# Patient Record
Sex: Female | Born: 1960
Health system: Southern US, Community
[De-identification: ages and names within clinical notes are randomized; demographics above are authoritative.]

## PROBLEM LIST (undated history)

## (undated) DIAGNOSIS — I2510 Atherosclerotic heart disease of native coronary artery without angina pectoris: Secondary | ICD-10-CM

## (undated) DIAGNOSIS — R002 Palpitations: Secondary | ICD-10-CM

## (undated) DIAGNOSIS — F419 Anxiety disorder, unspecified: Secondary | ICD-10-CM

## (undated) DIAGNOSIS — E669 Obesity, unspecified: Secondary | ICD-10-CM

## (undated) DIAGNOSIS — I10 Essential (primary) hypertension: Secondary | ICD-10-CM

## (undated) HISTORY — DX: Atherosclerotic heart disease of native coronary artery without angina pectoris: I25.10

## (undated) HISTORY — DX: Palpitations: R00.2

## (undated) HISTORY — DX: Obesity, unspecified: E66.9

## (undated) HISTORY — DX: Essential (primary) hypertension: I10

## (undated) HISTORY — DX: Anxiety disorder, unspecified: F41.9

## (undated) HISTORY — PX: WISDOM TOOTH EXTRACTION: SHX21

## (undated) HISTORY — PX: COLPOPEXY: SHX920

---

## 2015-02-03 DIAGNOSIS — I491 Atrial premature depolarization: Secondary | ICD-10-CM | POA: Diagnosis not present

## 2015-02-03 DIAGNOSIS — M7710 Lateral epicondylitis, unspecified elbow: Secondary | ICD-10-CM | POA: Diagnosis not present

## 2015-02-04 ENCOUNTER — Other Ambulatory Visit (HOSPITAL_COMMUNITY): Payer: Self-pay | Admitting: Family Medicine

## 2015-02-04 ENCOUNTER — Ambulatory Visit (HOSPITAL_COMMUNITY)
Admission: RE | Admit: 2015-02-04 | Discharge: 2015-02-04 | Disposition: A | Discharge status: Home / Self Care | Payer: 59 | Source: Ambulatory Visit | Attending: Family Medicine | Admitting: Family Medicine

## 2015-02-04 DIAGNOSIS — M25521 Pain in right elbow: Secondary | ICD-10-CM | POA: Diagnosis not present

## 2015-05-12 ENCOUNTER — Encounter: Payer: Self-pay | Admitting: *Deleted

## 2015-05-16 ENCOUNTER — Encounter: Payer: Self-pay | Admitting: Cardiovascular Disease

## 2015-05-16 ENCOUNTER — Ambulatory Visit (INDEPENDENT_AMBULATORY_CARE_PROVIDER_SITE_OTHER): Payer: 59 | Admitting: Cardiovascular Disease

## 2015-05-16 VITALS — BP 104/61 | HR 73 | Ht <= 58 in | Wt 105.0 lb

## 2015-05-16 DIAGNOSIS — R002 Palpitations: Secondary | ICD-10-CM | POA: Diagnosis not present

## 2015-05-16 DIAGNOSIS — Z136 Encounter for screening for cardiovascular disorders: Secondary | ICD-10-CM | POA: Diagnosis not present

## 2015-05-16 DIAGNOSIS — F419 Anxiety disorder, unspecified: Secondary | ICD-10-CM | POA: Diagnosis not present

## 2015-05-16 DIAGNOSIS — I491 Atrial premature depolarization: Secondary | ICD-10-CM

## 2015-05-16 MED ORDER — ALPRAZOLAM 0.5 MG PO TABS: 0.5000 mg | ORAL_TABLET | Freq: Two times a day (BID) | ORAL | Status: DC

## 2015-05-16 NOTE — Patient Instructions (Signed)
   Xanax refilled today.  Continue all other medications.   Your physician wants you to follow up in:  1 year.  You will receive a reminder letter in the mail one-two months in advance.  If you don't receive a letter, please call our office to schedule the follow up appointment

## 2015-05-16 NOTE — Progress Notes (Signed)
Patient ID: Stephanie Hamilton, female   DOB: 28-Jun-1960, 55 y.o.   MRN: [id number]       CARDIOLOGY CONSULT NOTE  Patient ID: Stephanie Hamilton MRN: [id number] DOB/AGE: 04-Feb-1960 55 y.o.  Admit date: (Not on file) Primary Physician: Stephanie Shad, MD Referring Physician: Teryl Lucy MD  Reason for Consultation: palpitations  HPI: The patient is a 55 year old woman with a history of anxiety and palpitations.  She was diagnosed with palpitations and PACs by Holter monitoring in 2000. She saw a cardiologist back then and she has been maintained on Xanax 0.5 mg twice daily for the past 17 years since and she has remained stable. She has never undergone an echocardiogram or stress test.   She denies exertional chest pain, shortness of breath, dizziness, leg swelling, and syncope. She once tried weaning herself off Xanax and experienced a recurrence of palpitations and wishes to remain on it. She said she takes it as prescribed and never abuses it.   She said both she and her husband are under a lot of stress with her son, Stephanie Hamilton, who was born with hydrocephalus and has had at least two VP shunts and suffers with seizures.  ECG performed in the office today which I personally reviewed demonstrates normal sinus rhythm with no ischemic ST segment or T-wave abnormalities, nor any arrhythmias.  Soc: Her husband is Stage manager, Research officer, trade union at [employer]. Son, Stephanie Hamilton (age 17 in 05/2015), born with hydrocephalus and has a VP shunt with h/o seizures.   Allergies  Allergen Reactions  . Bactrim [Sulfamethoxazole-Trimethoprim]   . Compazine [Prochlorperazine Edisylate]   . Tramadol     Current Outpatient Prescriptions  Medication Sig Dispense Refill  . ALPRAZolam (XANAX) 0.5 MG tablet Take 0.5 mg by mouth 2 (two) times daily.    . Calcium Carbonate-Vitamin D (CALTRATE 600+D) 600-400 MG-UNIT tablet Take 2 tablets by mouth daily.    Marland Kitchen Lysine 500 MG TABS Take 1 tablet by mouth daily.    .  Multiple Vitamin (MULTIVITAMIN) tablet Take 1 tablet by mouth daily.     No current facility-administered medications for this visit.    Past Medical History  Diagnosis Date  . Hypertension   . CAD (coronary artery disease)   . Palpitations   . Obesity   . Anxiety     No past surgical history on file.  Social History   Social History  . Marital Status: Married    Spouse Name: N/A  . Number of Children: N/A  . Years of Education: N/A   Occupational History  . Not on file.   Social History Main Topics  . Smoking status: Not on file  . Smokeless tobacco: Not on file  . Alcohol Use: Not on file  . Drug Use: Not on file  . Sexual Activity: Not on file   Other Topics Concern  . Not on file   Social History Narrative  . No narrative on file     No family history of premature CAD in 1st degree relatives.  Prior to Admission medications   Medication Sig Start Date End Date Taking? Authorizing Provider  ALPRAZolam Duanne Moron) 0.5 MG tablet Take 0.5 mg by mouth 2 (two) times daily.    Historical Provider, MD  Calcium Carbonate-Vitamin D (CALTRATE 600+D) 600-400 MG-UNIT tablet Take 2 tablets by mouth daily.    Historical Provider, MD  Lysine 500 MG TABS Take 1 tablet by mouth daily.    Historical Provider, MD  Multiple Vitamin (MULTIVITAMIN) tablet Take  1 tablet by mouth daily.    Historical Provider, MD     Review of systems complete and found to be negative unless listed above in HPI     Physical exam There were no vitals taken for this visit. General: NAD Neck: No JVD, no thyromegaly or thyroid nodule.  Lungs: Clear to auscultation bilaterally with normal respiratory effort. CV: Nondisplaced PMI. Regular rate and rhythm, normal S1/S2, no S3/S4, no murmur.  No peripheral edema.  No carotid bruit.  Normal pedal pulses.  Abdomen: Soft, nontender, no hepatosplenomegaly, no distention.  Skin: Intact without lesions or rashes.  Neurologic: Alert and oriented x 3.    Psych: Normal affect. Extremities: No clubbing or cyanosis.  HEENT: Normal.   ECG: Most recent ECG reviewed.  Labs:  No results found for: WBC, HGB, HCT, MCV, PLT No results for input(s): NA, K, CL, CO2, BUN, CREATININE, CALCIUM, PROT, BILITOT, ALKPHOS, ALT, AST, GLUCOSE in the last 168 hours.  Invalid input(s): LABALBU No results found for: CKTOTAL, CKMB, CKMBINDEX, TROPONINI No results found for: CHOL No results found for: HDL No results found for: LDLCALC No results found for: TRIG No results found for: CHOLHDL No results found for: LDLDIRECT       Studies: No results found.  ASSESSMENT AND PLAN:  1. Symptomatic PAC's: She has been stable for nearly 17 years on Xanax 0.5 mg twice daily. She is under a lot of stress at home caring for her son. She once tried weaning herself off of it and had a recurrence of palpitations. She would like to remain on it. I do not see a problem with this. She has never abused it.  Dispo: fu 1 year.   Signed: Kate Hamilton, M.D., F.A.C.C.  05/16/2015, 2:43 PM

## 2015-10-19 DIAGNOSIS — Z01419 Encounter for gynecological examination (general) (routine) without abnormal findings: Secondary | ICD-10-CM | POA: Diagnosis not present

## 2015-10-19 DIAGNOSIS — R319 Hematuria, unspecified: Secondary | ICD-10-CM | POA: Diagnosis not present

## 2015-10-19 DIAGNOSIS — Z1212 Encounter for screening for malignant neoplasm of rectum: Secondary | ICD-10-CM | POA: Diagnosis not present

## 2015-11-23 ENCOUNTER — Telehealth: Payer: Self-pay | Admitting: *Deleted

## 2015-11-23 NOTE — Telephone Encounter (Signed)
How much of the medication does she have left, I would prefer Dr Raliegh Ip to decide whether to refill for her  Stephanie Tayjah Lobdell MD

## 2015-11-23 NOTE — Telephone Encounter (Signed)
Fax received from pharm requesting Alprazolam 0.5mg  refill.  Dr. Bronson Ing did give rx for this at last OV.

## 2015-11-25 NOTE — Telephone Encounter (Signed)
Notified patient.  Stated she will be out on Monday.  Will fwd to provider to see if we can give enough till Dr. Bronson Ing comes back since he give initial prescription.

## 2015-11-25 NOTE — Telephone Encounter (Signed)
I am ok refilling her xanax for 2 weeks until Dr Raliegh Ip comes back   Zandra Abts MD

## 2015-11-28 ENCOUNTER — Other Ambulatory Visit: Payer: Self-pay

## 2015-11-28 MED ORDER — ALPRAZOLAM 0.5 MG PO TABS: 0.5000 mg | ORAL_TABLET | Freq: Two times a day (BID) | ORAL | 5 refills | Status: DC

## 2015-11-28 NOTE — Telephone Encounter (Signed)
Sent in RX

## 2015-11-29 NOTE — Telephone Encounter (Signed)
Patient notified that refills had already been sent to pharmacy.

## 2015-12-20 DIAGNOSIS — R509 Fever, unspecified: Secondary | ICD-10-CM | POA: Diagnosis not present

## 2015-12-20 DIAGNOSIS — R05 Cough: Secondary | ICD-10-CM | POA: Diagnosis not present

## 2015-12-20 DIAGNOSIS — J111 Influenza due to unidentified influenza virus with other respiratory manifestations: Secondary | ICD-10-CM | POA: Diagnosis not present

## 2016-01-18 DIAGNOSIS — Z719 Counseling, unspecified: Secondary | ICD-10-CM | POA: Diagnosis not present

## 2016-01-18 DIAGNOSIS — N390 Urinary tract infection, site not specified: Secondary | ICD-10-CM | POA: Diagnosis not present

## 2016-05-17 ENCOUNTER — Ambulatory Visit (INDEPENDENT_AMBULATORY_CARE_PROVIDER_SITE_OTHER): Payer: 59 | Admitting: Cardiovascular Disease

## 2016-05-17 ENCOUNTER — Encounter: Payer: Self-pay | Admitting: Cardiovascular Disease

## 2016-05-17 VITALS — BP 102/64 | HR 81 | Ht <= 58 in | Wt 107.6 lb

## 2016-05-17 DIAGNOSIS — I491 Atrial premature depolarization: Secondary | ICD-10-CM | POA: Diagnosis not present

## 2016-05-17 DIAGNOSIS — F419 Anxiety disorder, unspecified: Secondary | ICD-10-CM | POA: Diagnosis not present

## 2016-05-17 DIAGNOSIS — R002 Palpitations: Secondary | ICD-10-CM

## 2016-05-17 MED ORDER — ALPRAZOLAM 0.5 MG PO TABS: 0.5000 mg | ORAL_TABLET | Freq: Two times a day (BID) | ORAL | 4 refills | Status: DC

## 2016-05-17 NOTE — Patient Instructions (Signed)

## 2016-05-17 NOTE — Progress Notes (Signed)
SUBJECTIVE: The patient presents for follow-up of symptomatic PACs. She takes Xanax 0.5 mg twice daily to alleviate this as she is under a lot of stress at home caring for her son. She once tried weaning herself off of Xanax and had a recurrence of palpitations. Her son was born with hydrocephalus and has had at least 2 VP shunts and suffers with seizures.  She is doing well. [name] recently got a job at [employer] but unfortunately the insurance changed and his prescriptions are no longer covered. This has led to a lot of stress for both she and her husband.  Caffeine and chocolate aggravate palpitations but she seldom consumes them.  She denies chest pain, leg swelling, and shortness of breath.  ECG performed in the office today which I ordered and personally interpreted demonstrates normal sinus rhythm with no ischemic ST segment or T-wave abnormalities, nor any arrhythmias.    Soc Hx: Her husband is Stage manager, Research officer, trade union at [employer]. Son, [name] (age 56 in 05/2016), born with hydrocephalus and has a VP shunt with h/o seizures.   Review of Systems: As per "subjective", otherwise negative.  Allergies  Allergen Reactions  . Bactrim [Sulfamethoxazole-Trimethoprim] Other (See Comments)    Fast heart rate Shaky feeling  . Compazine [Prochlorperazine Edisylate] Other (See Comments)    Made her neck feel like it was being twisted on one side  . Tramadol Nausea And Vomiting    Current Outpatient Prescriptions  Medication Sig Dispense Refill  . ALPRAZolam (XANAX) 0.5 MG tablet Take 1 tablet (0.5 mg total) by mouth 2 (two) times daily. 60 tablet 5  . Calcium Carbonate-Vitamin D (CALTRATE 600+D) 600-400 MG-UNIT tablet Take 2 tablets by mouth daily.    . Cranberry Fruit Concentrate 12600 MG CAPS Take by mouth daily.    Marland Kitchen Lysine 500 MG TABS Take 1 tablet by mouth daily.    . Probiotic Product (PROBIOTIC-10 PO) Take by mouth.    . Pseudoephedrine-Ibuprofen (ADVIL  COLD/SINUS PO) Take 1 tablet by mouth daily.     No current facility-administered medications for this visit.     Past Medical History:  Diagnosis Date  . Anxiety   . CAD (coronary artery disease)   . Hypertension   . Obesity   . Palpitations     Past Surgical History:  Procedure Laterality Date  . COLPOPEXY    . WISDOM TOOTH EXTRACTION      Social History   Social History  . Marital status: Married    Spouse name: N/A  . Number of children: N/A  . Years of education: N/A   Occupational History  . Not on file.   Social History Main Topics  . Smoking status: Never Smoker  . Smokeless tobacco: Never Used  . Alcohol use Not on file  . Drug use: Unknown  . Sexual activity: Not on file   Other Topics Concern  . Not on file   Social History Narrative  . No narrative on file     Vitals:   05/17/16 1005  BP: 102/64  Pulse: 81  SpO2: 98%  Weight: 107 lb 9.6 oz (48.8 kg)  Height: 4\' 10"  (1.473 m)    Wt Readings from Last 3 Encounters:  05/17/16 107 lb 9.6 oz (48.8 kg)  05/16/15 105 lb (47.6 kg)     PHYSICAL EXAM General: NAD HEENT: Normal. Neck: No JVD, no thyromegaly. Lungs: Clear to auscultation bilaterally with normal respiratory effort. CV: Nondisplaced PMI.  Regular rate and rhythm, normal S1/S2, no S3/S4, no murmur. No pretibial or periankle edema.  No carotid bruit.   Abdomen: Soft, nontender, no distention.  Neurologic: Alert and oriented.  Psych: Normal affect. Skin: Normal. Musculoskeletal: No gross deformities.    ECG: Most recent ECG reviewed.   Labs: No results found for: K, BUN, CREATININE, ALT, TSH, HGB   Lipids: No results found for: LDLCALC, LDLDIRECT, CHOL, TRIG, HDL     ASSESSMENT AND PLAN:  1. Symptomatic PAC's: Symptomatically stable. She has used Xanax 0.5 mg twice daily for nearly 18 years. She is under a lot of stress at home caring for her son. She once tried weaning herself off of Xanax and had a recurrence of  palpitations. She has never abused Xanax. I will refill her prescription.    Disposition: Follow up 1 year  Kate Sable, M.D., F.A.C.C.

## 2016-05-17 NOTE — Addendum Note (Signed)
Addended by: Laurine Blazer on: 05/17/2016 10:47 AM   Modules accepted: Orders

## 2016-06-04 DIAGNOSIS — H35371 Puckering of macula, right eye: Secondary | ICD-10-CM | POA: Diagnosis not present

## 2016-06-04 DIAGNOSIS — H2513 Age-related nuclear cataract, bilateral: Secondary | ICD-10-CM | POA: Diagnosis not present

## 2016-09-01 DIAGNOSIS — Z789 Other specified health status: Secondary | ICD-10-CM | POA: Diagnosis not present

## 2016-09-01 DIAGNOSIS — N390 Urinary tract infection, site not specified: Secondary | ICD-10-CM | POA: Diagnosis not present

## 2016-10-01 DIAGNOSIS — F410 Panic disorder [episodic paroxysmal anxiety] without agoraphobia: Secondary | ICD-10-CM | POA: Diagnosis not present

## 2016-10-01 DIAGNOSIS — Z23 Encounter for immunization: Secondary | ICD-10-CM | POA: Diagnosis not present

## 2016-10-01 DIAGNOSIS — Z6822 Body mass index (BMI) 22.0-22.9, adult: Secondary | ICD-10-CM | POA: Diagnosis not present

## 2016-10-16 ENCOUNTER — Other Ambulatory Visit: Payer: Self-pay | Admitting: *Deleted

## 2016-10-16 MED ORDER — ALPRAZOLAM 0.5 MG PO TABS: 0.5000 mg | ORAL_TABLET | Freq: Two times a day (BID) | ORAL | 4 refills | Status: DC

## 2016-10-18 DIAGNOSIS — Z1211 Encounter for screening for malignant neoplasm of colon: Secondary | ICD-10-CM | POA: Diagnosis not present

## 2016-10-18 DIAGNOSIS — Z1212 Encounter for screening for malignant neoplasm of rectum: Secondary | ICD-10-CM | POA: Diagnosis not present

## 2016-10-22 DIAGNOSIS — Z1212 Encounter for screening for malignant neoplasm of rectum: Secondary | ICD-10-CM | POA: Diagnosis not present

## 2016-10-22 DIAGNOSIS — Z01419 Encounter for gynecological examination (general) (routine) without abnormal findings: Secondary | ICD-10-CM | POA: Diagnosis not present

## 2016-10-22 DIAGNOSIS — Z1151 Encounter for screening for human papillomavirus (HPV): Secondary | ICD-10-CM | POA: Diagnosis not present

## 2016-10-26 DIAGNOSIS — Z23 Encounter for immunization: Secondary | ICD-10-CM | POA: Diagnosis not present

## 2016-12-26 DIAGNOSIS — N390 Urinary tract infection, site not specified: Secondary | ICD-10-CM | POA: Diagnosis not present

## 2016-12-26 DIAGNOSIS — Z6823 Body mass index (BMI) 23.0-23.9, adult: Secondary | ICD-10-CM | POA: Diagnosis not present

## 2017-03-09 DIAGNOSIS — J029 Acute pharyngitis, unspecified: Secondary | ICD-10-CM | POA: Diagnosis not present

## 2017-03-09 DIAGNOSIS — J111 Influenza due to unidentified influenza virus with other respiratory manifestations: Secondary | ICD-10-CM | POA: Diagnosis not present

## 2017-03-13 ENCOUNTER — Other Ambulatory Visit: Payer: Self-pay

## 2017-03-13 MED ORDER — ALPRAZOLAM 0.5 MG PO TABS: 0.5000 mg | ORAL_TABLET | Freq: Two times a day (BID) | ORAL | 4 refills | Status: DC

## 2017-03-26 DIAGNOSIS — Z13220 Encounter for screening for lipoid disorders: Secondary | ICD-10-CM | POA: Diagnosis not present

## 2017-03-26 DIAGNOSIS — R5383 Other fatigue: Secondary | ICD-10-CM | POA: Diagnosis not present

## 2017-03-26 DIAGNOSIS — F410 Panic disorder [episodic paroxysmal anxiety] without agoraphobia: Secondary | ICD-10-CM | POA: Diagnosis not present

## 2017-04-01 DIAGNOSIS — Z6823 Body mass index (BMI) 23.0-23.9, adult: Secondary | ICD-10-CM | POA: Diagnosis not present

## 2017-04-01 DIAGNOSIS — F410 Panic disorder [episodic paroxysmal anxiety] without agoraphobia: Secondary | ICD-10-CM | POA: Diagnosis not present

## 2017-05-16 ENCOUNTER — Encounter: Payer: Self-pay | Admitting: Cardiovascular Disease

## 2017-05-16 ENCOUNTER — Ambulatory Visit: Payer: 59 | Admitting: Cardiovascular Disease

## 2017-05-16 VITALS — BP 120/72 | HR 78 | Ht 59.0 in | Wt 110.0 lb

## 2017-05-16 DIAGNOSIS — R002 Palpitations: Secondary | ICD-10-CM | POA: Diagnosis not present

## 2017-05-16 DIAGNOSIS — I491 Atrial premature depolarization: Secondary | ICD-10-CM | POA: Diagnosis not present

## 2017-05-16 NOTE — Patient Instructions (Signed)

## 2017-05-16 NOTE — Progress Notes (Signed)
ekg 

## 2017-05-16 NOTE — Progress Notes (Signed)
SUBJECTIVE: The patient presents for follow-up of symptomatic PACs. She takes Xanax 0.5 mg twice daily to alleviate this as she is under a lot of stress at home caring for her son. She once tried weaning herself off of Xanax and had a recurrence of palpitations.  She has been on Xanax since 2001. Her son, [name], was born with hydrocephalus and has had at least 2 VP shunts and suffers with seizures.  Caffeine and chocolate aggravate palpitations but she seldom consumes them.  She tries to drink diet tea and decaffeinated sodas.  She drinks a glass of red wine about 3 times per week.  She and her husband have been struggling emotionally recently as their 4 year old niece, [name] sister's youngest daughter, committed suicide last week at the age of 56.  She had been apparently been bullied in school in [place].  She has been walking at least 7000 steps on a daily basis.  She also uses a rowing machine for 15-minute intervals.  ECG performed in the office today which I ordered and personally interpreted demonstrates normal sinus rhythm with no ischemic ST segment or T-wave abnormalities, nor any arrhythmias.    Soc Hx: Her husband, [name], is an Research officer, trade union at [employer] and [employer]. Their son, [name] (age 44 in 05/2016), was born with hydrocephalus and has a VP shunt with h/o seizures.     Review of Systems: As per "subjective", otherwise negative.  Allergies  Allergen Reactions  . Bactrim [Sulfamethoxazole-Trimethoprim] Other (See Comments)    Fast heart rate Shaky feeling  . Compazine [Prochlorperazine Edisylate] Other (See Comments)    Made her neck feel like it was being twisted on one side  . Tramadol Nausea And Vomiting    Current Outpatient Medications  Medication Sig Dispense Refill  . ALPRAZolam (XANAX) 0.5 MG tablet Take 1 tablet (0.5 mg total) by mouth 2 (two) times daily. 60 tablet 4  . Calcium Carbonate-Vitamin D (CALTRATE 600+D) 600-400  MG-UNIT tablet Take 2 tablets by mouth daily.    . Cranberry Fruit Concentrate 12600 MG CAPS Take by mouth daily.    Marland Kitchen Lysine 500 MG TABS Take 1 tablet by mouth daily.    . Probiotic Product (PROBIOTIC-10 PO) Take by mouth.    . Pseudoephedrine-Ibuprofen (ADVIL COLD/SINUS PO) Take 1 tablet by mouth daily.     No current facility-administered medications for this visit.     Past Medical History:  Diagnosis Date  . Anxiety   . CAD (coronary artery disease)   . Hypertension   . Obesity   . Palpitations     Past Surgical History:  Procedure Laterality Date  . COLPOPEXY    . WISDOM TOOTH EXTRACTION      Social History   Socioeconomic History  . Marital status: Married    Spouse name: Not on file  . Number of children: Not on file  . Years of education: Not on file  . Highest education level: Not on file  Occupational History  . Not on file  Social Needs  . Financial resource strain: Not on file  . Food insecurity:    Worry: Not on file    Inability: Not on file  . Transportation needs:    Medical: Not on file    Non-medical: Not on file  Tobacco Use  . Smoking status: Never Smoker  . Smokeless tobacco: Never Used  Substance and Sexual Activity  . Alcohol use: Not on file  . Drug  use: Not on file  . Sexual activity: Not on file  Lifestyle  . Physical activity:    Days per week: Not on file    Minutes per session: Not on file  . Stress: Not on file  Relationships  . Social connections:    Talks on phone: Not on file    Gets together: Not on file    Attends religious service: Not on file    Active member of club or organization: Not on file    Attends meetings of clubs or organizations: Not on file    Relationship status: Not on file  . Intimate partner violence:    Fear of current or ex partner: Not on file    Emotionally abused: Not on file    Physically abused: Not on file    Forced sexual activity: Not on file  Other Topics Concern  . Not on file    Social History Narrative  . Not on file     Vitals:   05/16/17 1528  BP: 120/72  Pulse: 78  SpO2: 98%  Weight: 110 lb (49.9 kg)  Height: 4\' 11"  (1.499 m)    Wt Readings from Last 3 Encounters:  05/16/17 110 lb (49.9 kg)  05/17/16 107 lb 9.6 oz (48.8 kg)  05/16/15 105 lb (47.6 kg)     PHYSICAL EXAM General: NAD HEENT: Normal. Neck: No JVD, no thyromegaly. Lungs: Clear to auscultation bilaterally with normal respiratory effort. CV: Regular rate and rhythm, normal S1/S2, no S3/S4, no murmur. No pretibial or periankle edema.  No carotid bruit.   Abdomen: Soft, nontender, no distention.  Neurologic: Alert and oriented.  Psych: Normal affect. Skin: Normal. Musculoskeletal: No gross deformities.    ECG: Most recent ECG reviewed.   Labs: No results found for: K, BUN, CREATININE, ALT, TSH, HGB   Lipids: No results found for: LDLCALC, LDLDIRECT, CHOL, TRIG, HDL     ASSESSMENT AND PLAN:  1. Symptomatic PAC's: Symptomatically stable. She has used Xanax 0.5 mg twice daily for nearly 18 years. She is under a lot of stress at home caring for her son. She once tried weaning herself off of Xanax and had a recurrence of palpitations. She has never abused Xanax. I will refill her prescription when she is due for refills in August 2019.     Disposition: Follow up 1 year   Kate Sable, M.D., F.A.C.C.

## 2017-08-05 ENCOUNTER — Other Ambulatory Visit: Payer: Self-pay | Admitting: *Deleted

## 2017-08-05 MED ORDER — ALPRAZOLAM 0.5 MG PO TABS: 0.5000 mg | ORAL_TABLET | Freq: Two times a day (BID) | ORAL | 5 refills | Status: DC

## 2017-10-14 DIAGNOSIS — Z6823 Body mass index (BMI) 23.0-23.9, adult: Secondary | ICD-10-CM | POA: Diagnosis not present

## 2017-10-14 DIAGNOSIS — M545 Low back pain: Secondary | ICD-10-CM | POA: Diagnosis not present

## 2017-10-14 DIAGNOSIS — G252 Other specified forms of tremor: Secondary | ICD-10-CM | POA: Diagnosis not present

## 2017-10-14 DIAGNOSIS — R5383 Other fatigue: Secondary | ICD-10-CM | POA: Diagnosis not present

## 2017-10-14 DIAGNOSIS — F410 Panic disorder [episodic paroxysmal anxiety] without agoraphobia: Secondary | ICD-10-CM | POA: Diagnosis not present

## 2017-10-14 DIAGNOSIS — Z1389 Encounter for screening for other disorder: Secondary | ICD-10-CM | POA: Diagnosis not present

## 2017-10-14 DIAGNOSIS — Z23 Encounter for immunization: Secondary | ICD-10-CM | POA: Diagnosis not present

## 2017-10-14 DIAGNOSIS — Z1331 Encounter for screening for depression: Secondary | ICD-10-CM | POA: Diagnosis not present

## 2017-10-17 ENCOUNTER — Other Ambulatory Visit (HOSPITAL_COMMUNITY): Payer: Self-pay | Admitting: Family Medicine

## 2017-10-17 DIAGNOSIS — R748 Abnormal levels of other serum enzymes: Secondary | ICD-10-CM

## 2017-10-23 ENCOUNTER — Ambulatory Visit (HOSPITAL_COMMUNITY): Payer: 59

## 2017-10-31 DIAGNOSIS — R3129 Other microscopic hematuria: Secondary | ICD-10-CM | POA: Diagnosis not present

## 2017-10-31 DIAGNOSIS — R5383 Other fatigue: Secondary | ICD-10-CM | POA: Diagnosis not present

## 2017-10-31 DIAGNOSIS — Z9189 Other specified personal risk factors, not elsewhere classified: Secondary | ICD-10-CM | POA: Diagnosis not present

## 2017-10-31 DIAGNOSIS — R748 Abnormal levels of other serum enzymes: Secondary | ICD-10-CM | POA: Diagnosis not present

## 2017-10-31 DIAGNOSIS — Z13220 Encounter for screening for lipoid disorders: Secondary | ICD-10-CM | POA: Diagnosis not present

## 2017-10-31 DIAGNOSIS — Z01411 Encounter for gynecological examination (general) (routine) with abnormal findings: Secondary | ICD-10-CM | POA: Diagnosis not present

## 2017-10-31 DIAGNOSIS — Z01419 Encounter for gynecological examination (general) (routine) without abnormal findings: Secondary | ICD-10-CM | POA: Diagnosis not present

## 2017-10-31 DIAGNOSIS — Z1212 Encounter for screening for malignant neoplasm of rectum: Secondary | ICD-10-CM | POA: Diagnosis not present

## 2017-11-14 DIAGNOSIS — Z9189 Other specified personal risk factors, not elsewhere classified: Secondary | ICD-10-CM | POA: Diagnosis not present

## 2017-11-14 DIAGNOSIS — R748 Abnormal levels of other serum enzymes: Secondary | ICD-10-CM | POA: Diagnosis not present

## 2017-11-14 DIAGNOSIS — R5383 Other fatigue: Secondary | ICD-10-CM | POA: Diagnosis not present

## 2017-11-14 DIAGNOSIS — M545 Low back pain: Secondary | ICD-10-CM | POA: Diagnosis not present

## 2017-11-22 ENCOUNTER — Ambulatory Visit (HOSPITAL_COMMUNITY)
Admission: RE | Admit: 2017-11-22 | Discharge: 2017-11-22 | Disposition: A | Discharge status: Home / Self Care | Payer: 59 | Source: Ambulatory Visit | Attending: Family Medicine | Admitting: Family Medicine

## 2017-11-22 DIAGNOSIS — R7989 Other specified abnormal findings of blood chemistry: Secondary | ICD-10-CM | POA: Diagnosis not present

## 2017-11-22 DIAGNOSIS — R748 Abnormal levels of other serum enzymes: Secondary | ICD-10-CM | POA: Insufficient documentation

## 2017-11-28 DIAGNOSIS — Z9189 Other specified personal risk factors, not elsewhere classified: Secondary | ICD-10-CM | POA: Diagnosis not present

## 2017-11-28 DIAGNOSIS — R748 Abnormal levels of other serum enzymes: Secondary | ICD-10-CM | POA: Diagnosis not present

## 2017-12-04 DIAGNOSIS — R05 Cough: Secondary | ICD-10-CM | POA: Diagnosis not present

## 2017-12-04 DIAGNOSIS — J0101 Acute recurrent maxillary sinusitis: Secondary | ICD-10-CM | POA: Diagnosis not present

## 2017-12-04 DIAGNOSIS — J029 Acute pharyngitis, unspecified: Secondary | ICD-10-CM | POA: Diagnosis not present

## 2017-12-04 DIAGNOSIS — Z6824 Body mass index (BMI) 24.0-24.9, adult: Secondary | ICD-10-CM | POA: Diagnosis not present

## 2018-01-21 DIAGNOSIS — R05 Cough: Secondary | ICD-10-CM | POA: Diagnosis not present

## 2018-01-21 DIAGNOSIS — Z6824 Body mass index (BMI) 24.0-24.9, adult: Secondary | ICD-10-CM | POA: Diagnosis not present

## 2018-01-21 DIAGNOSIS — R509 Fever, unspecified: Secondary | ICD-10-CM | POA: Diagnosis not present

## 2018-01-31 ENCOUNTER — Other Ambulatory Visit: Payer: Self-pay | Admitting: *Deleted

## 2018-01-31 MED ORDER — ALPRAZOLAM 0.5 MG PO TABS: 0.5000 mg | ORAL_TABLET | Freq: Two times a day (BID) | ORAL | 5 refills | Status: DC

## 2018-03-31 DIAGNOSIS — R5383 Other fatigue: Secondary | ICD-10-CM | POA: Diagnosis not present

## 2018-03-31 DIAGNOSIS — Z6824 Body mass index (BMI) 24.0-24.9, adult: Secondary | ICD-10-CM | POA: Diagnosis not present

## 2018-03-31 DIAGNOSIS — F410 Panic disorder [episodic paroxysmal anxiety] without agoraphobia: Secondary | ICD-10-CM | POA: Diagnosis not present

## 2018-03-31 DIAGNOSIS — M545 Low back pain: Secondary | ICD-10-CM | POA: Diagnosis not present

## 2018-03-31 DIAGNOSIS — Z0001 Encounter for general adult medical examination with abnormal findings: Secondary | ICD-10-CM | POA: Diagnosis not present

## 2018-05-21 ENCOUNTER — Telehealth: Payer: Self-pay | Admitting: *Deleted

## 2018-05-21 NOTE — Telephone Encounter (Signed)
   Primary Cardiologist:  Dr. Bronson Ing    Patient contacted.  History reviewed.  No symptoms to suggest any unstable cardiac conditions.  Based on discussion, with current pandemic situation, we will be postponing this appointment for Stephanie Hamilton with a plan for f/u on 08/20/2018.  If symptoms change, she has been instructed to contact our office.         Marland Kitchen

## 2018-05-26 ENCOUNTER — Ambulatory Visit: Payer: 59 | Admitting: Cardiovascular Disease

## 2018-07-31 ENCOUNTER — Other Ambulatory Visit: Payer: Self-pay | Admitting: *Deleted

## 2018-07-31 MED ORDER — ALPRAZOLAM 0.5 MG PO TABS: 0.5000 mg | ORAL_TABLET | Freq: Two times a day (BID) | ORAL | 5 refills | Status: DC

## 2018-08-20 ENCOUNTER — Telehealth (INDEPENDENT_AMBULATORY_CARE_PROVIDER_SITE_OTHER): Payer: 59 | Admitting: Cardiovascular Disease

## 2018-08-20 ENCOUNTER — Encounter: Payer: Self-pay | Admitting: Cardiovascular Disease

## 2018-08-20 VITALS — BP 108/74 | HR 80 | Ht <= 58 in | Wt 115.6 lb

## 2018-08-20 DIAGNOSIS — R002 Palpitations: Secondary | ICD-10-CM | POA: Diagnosis not present

## 2018-08-20 DIAGNOSIS — I491 Atrial premature depolarization: Secondary | ICD-10-CM

## 2018-08-20 NOTE — Progress Notes (Signed)
Virtual Visit via Video Note   This visit type was conducted due to national recommendations for restrictions regarding the COVID-19 Pandemic (e.g. social distancing) in an effort to limit this patient's exposure and mitigate transmission in our community.  Due to her co-morbid illnesses, this patient is at least at moderate risk for complications without adequate follow up.  This format is felt to be most appropriate for this patient at this time.  All issues noted in this document were discussed and addressed.  A limited physical exam was performed with this format.  Please refer to the patient's chart for her consent to telehealth for [facility].   Date:  08/20/2018   ID:  Stephanie Hamilton, DOB 05-30-1960, MRN [id number]  Patient Location: Home Provider Location: Office  PCP:  Caryl Bis, MD  Cardiologist:  Kate Sable, MD  Electrophysiologist:  None   Evaluation Performed:  Follow-Up Visit  Chief Complaint:  PAC's  History of Present Illness:    Stephanie Hamilton is a 58 y.o. female with symptomatic PACs. She takes Xanax 0.5 mg twice daily to alleviate this as she is under a lot of stress at home caring for her son. She once tried weaning herself off of Xanax and had a recurrence of palpitations.  She has been on Xanax since 2001. Her son, Stephanie Hamilton, was born with hydrocephalus and has had at least 2 VP shunts and suffers with seizures.  Caffeine and chocolate aggravate palpitations but she seldomconsumes them.  She tries to drink diet tea and decaffeinated sodas.  She drinks a glass of red wine about 3 times per week.  She has been doing well.  Back in November 2019 she was very ill with a respiratory infection and she believes she likely had [place].  Her LFTs were apparently elevated but as soon as her pulmonary symptoms resolved which took about a month, her LFTs normalized as well.  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 Sabra Heck is going to retire in the near future.  She recently took a trip to [place] with her best friend.  They recently celebrated her birthday and her husband's birthday which was [birth year].  The patient does not have symptoms concerning for COVID-19 infection (fever, chills, cough, or new shortness of breath).    SocHx:Her husband, Stephanie Hamilton, is an Research officer, trade union at [employer] and [employer]. Their son, Stephanie Hamilton (age 22in 05/2016), was born with hydrocephalus and has a VP shunt with h/o seizures.   Past Medical History:  Diagnosis Date  . Anxiety   . CAD (coronary artery disease)   . Hypertension   . Obesity   . Palpitations    Past Surgical History:  Procedure Laterality Date  . COLPOPEXY    . WISDOM TOOTH EXTRACTION       Current Meds  Medication Sig  . ALPRAZolam (XANAX) 0.5 MG tablet Take 1 tablet (0.5 mg total) by mouth 2 (two) times daily.  . Calcium Carbonate-Vitamin D (CALTRATE 600+D) 600-400 MG-UNIT tablet Take 2 tablets by mouth daily.  . Cranberry Fruit Concentrate 12600 MG CAPS Take 1 capsule by mouth daily.   . Probiotic Product (PROBIOTIC-10 PO) Take by mouth.  . Pseudoephedrine-Ibuprofen (ADVIL COLD/SINUS PO) Take 1 tablet by mouth daily.     Allergies:   Bactrim [sulfamethoxazole-trimethoprim], Compazine [prochlorperazine edisylate], and Tramadol   Social History  Tobacco Use  . Smoking status: Never Smoker  . Smokeless tobacco: Never Used  Substance Use Topics  . Alcohol use: Not on file  . Drug use: Not on file     Family Hx: The patient's family history includes COPD in her mother; Coronary artery disease in her father and mother; Heart disease in her father and mother; High blood pressure in her father and mother.  ROS:   Please see the history of present illness.     All other systems reviewed and are negative.   Prior CV studies:   The following studies were reviewed today:   NA  Labs/Other Tests and Data Reviewed:    EKG:  No ECG reviewed.  Recent Labs: No results found for requested labs within last 8760 hours.   Recent Lipid Panel No results found for: CHOL, TRIG, HDL, CHOLHDL, LDLCALC, LDLDIRECT  Wt Readings from Last 3 Encounters:  08/20/18 115 lb 9.6 oz (52.4 kg)  05/16/17 110 lb (49.9 kg)  05/17/16 107 lb 9.6 oz (48.8 kg)     Objective:    Vital Signs:  BP 108/74   Pulse 80   Ht 4\' 10"  (1.473 m)   Wt 115 lb 9.6 oz (52.4 kg)   BMI 24.16 kg/m    VITAL SIGNS:  reviewed GEN:  no acute distress EYES:  sclerae anicteric, EOMI - Extraocular Movements Intact RESPIRATORY:  normal respiratory effort, symmetric expansion NEURO:  alert and oriented x 3, no obvious focal deficit PSYCH:  normal affect  ASSESSMENT & PLAN:    1. Symptomatic PAC's/palpitations:Symptomatically stable. She has used Xanax 0.5mg  twice daily for nearly 18 years.  Lately, she has not had to take the evening dose because she has been so tired and falls asleep.  She is under a lot of stress at home caring for her son. She once tried weaning herself off of Xanax and had a recurrence of palpitations. She has never abused Xanax. I will refill her prescription when she is due for refills.   COVID-19 Education: The signs and symptoms of COVID-19 were discussed with the patient and how to seek care for testing (follow up with PCP or arrange E-visit).  The importance of social distancing was discussed today.  Time:   Today, I have spent 15 minutes with the patient with telehealth technology discussing the above problems.     Medication Adjustments/Labs and Tests Ordered: Current medicines are reviewed at length with the patient today.  Concerns regarding medicines are outlined above.   Tests Ordered: No orders of the defined types were placed in this encounter.   Medication Changes: No orders of the defined types were placed in this encounter.   Follow Up:  Virtual  Visit or In Person in 1 year(s)  Signed, Kate Sable, MD  08/20/2018 12:54 PM    [place]

## 2018-08-20 NOTE — Patient Instructions (Addendum)

## 2018-09-22 DIAGNOSIS — R5383 Other fatigue: Secondary | ICD-10-CM | POA: Diagnosis not present

## 2018-09-22 DIAGNOSIS — F410 Panic disorder [episodic paroxysmal anxiety] without agoraphobia: Secondary | ICD-10-CM | POA: Diagnosis not present

## 2018-09-22 DIAGNOSIS — Z13220 Encounter for screening for lipoid disorders: Secondary | ICD-10-CM | POA: Diagnosis not present

## 2018-09-22 DIAGNOSIS — R748 Abnormal levels of other serum enzymes: Secondary | ICD-10-CM | POA: Diagnosis not present

## 2018-09-25 DIAGNOSIS — F410 Panic disorder [episodic paroxysmal anxiety] without agoraphobia: Secondary | ICD-10-CM | POA: Diagnosis not present

## 2018-09-25 DIAGNOSIS — Z23 Encounter for immunization: Secondary | ICD-10-CM | POA: Diagnosis not present

## 2018-09-25 DIAGNOSIS — M545 Low back pain: Secondary | ICD-10-CM | POA: Diagnosis not present

## 2018-09-25 DIAGNOSIS — Z6823 Body mass index (BMI) 23.0-23.9, adult: Secondary | ICD-10-CM | POA: Diagnosis not present

## 2018-09-25 DIAGNOSIS — R300 Dysuria: Secondary | ICD-10-CM | POA: Diagnosis not present

## 2018-09-25 DIAGNOSIS — R5383 Other fatigue: Secondary | ICD-10-CM | POA: Diagnosis not present

## 2018-10-27 DIAGNOSIS — H43813 Vitreous degeneration, bilateral: Secondary | ICD-10-CM | POA: Diagnosis not present

## 2018-10-27 DIAGNOSIS — H02844 Edema of left upper eyelid: Secondary | ICD-10-CM | POA: Diagnosis not present

## 2018-10-27 DIAGNOSIS — H2513 Age-related nuclear cataract, bilateral: Secondary | ICD-10-CM | POA: Diagnosis not present

## 2018-10-27 DIAGNOSIS — H35372 Puckering of macula, left eye: Secondary | ICD-10-CM | POA: Diagnosis not present

## 2019-01-28 ENCOUNTER — Telehealth: Payer: Self-pay | Admitting: *Deleted

## 2019-01-28 MED ORDER — ALPRAZOLAM 0.5 MG PO TABS: 0.5000 mg | ORAL_TABLET | Freq: Two times a day (BID) | ORAL | 5 refills | Status: AC

## 2019-01-28 NOTE — Telephone Encounter (Signed)
[facility] requesting refill on Alprazolam 0.5mg  - one tablet by mouth twice daiy

## 2019-01-28 NOTE — Telephone Encounter (Signed)
Noted - will fax refill to pharmacy

## 2019-01-28 NOTE — Telephone Encounter (Signed)
That would be fine 

## 2019-02-10 IMAGING — US US ABDOMEN LIMITED
1 series · 14 of 25 positions shown · non-contrast
Comparison: None.

CLINICAL DATA: Elevated LFTs.  History of hypertension.

EXAM:
ULTRASOUND ABDOMEN LIMITED RIGHT UPPER QUADRANT

[Series 1: us abdomen limited · 0.10mm/px · 14 of 44 slices shown]
[im 1/44]
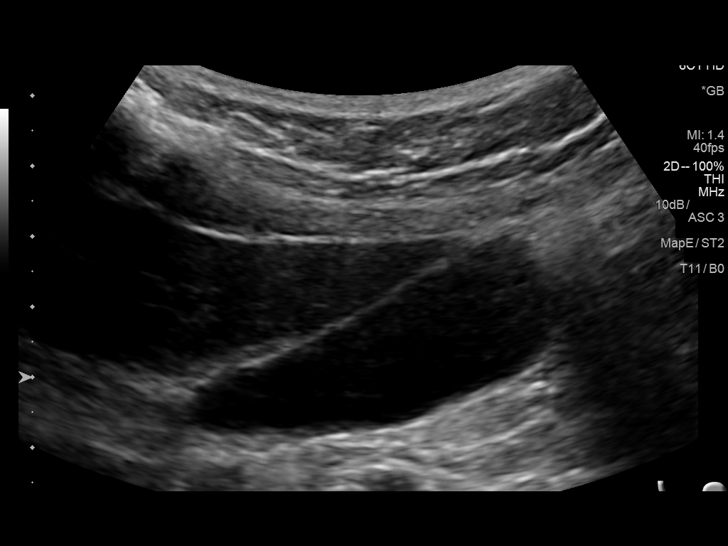
[im 4/44]
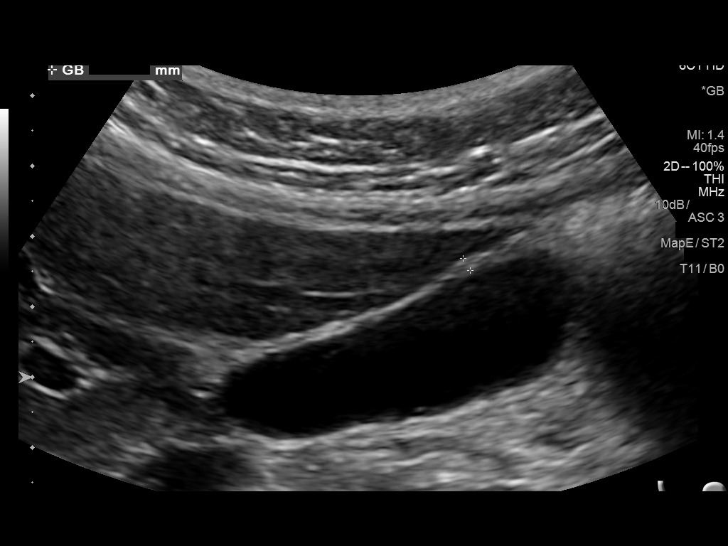
[im 8/44]
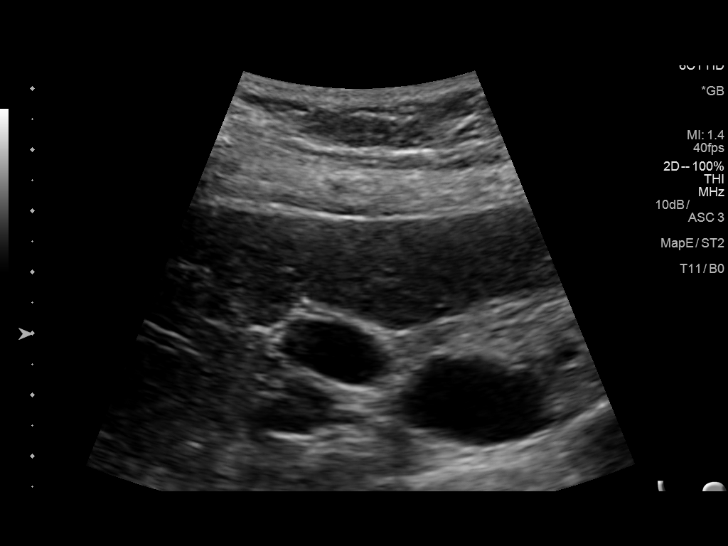
[im 11/44]
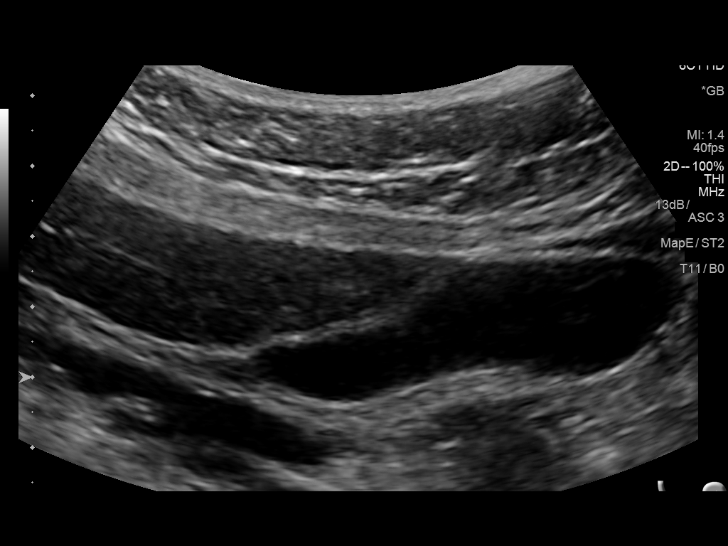
[im 15/44]
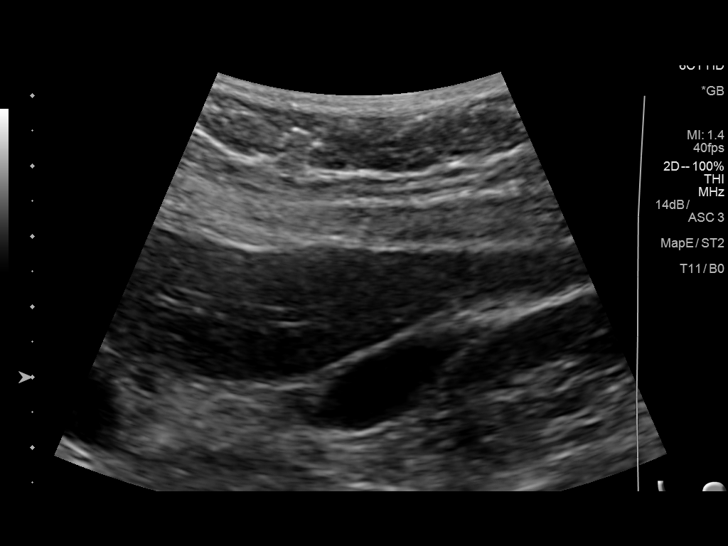
[im 17/44]
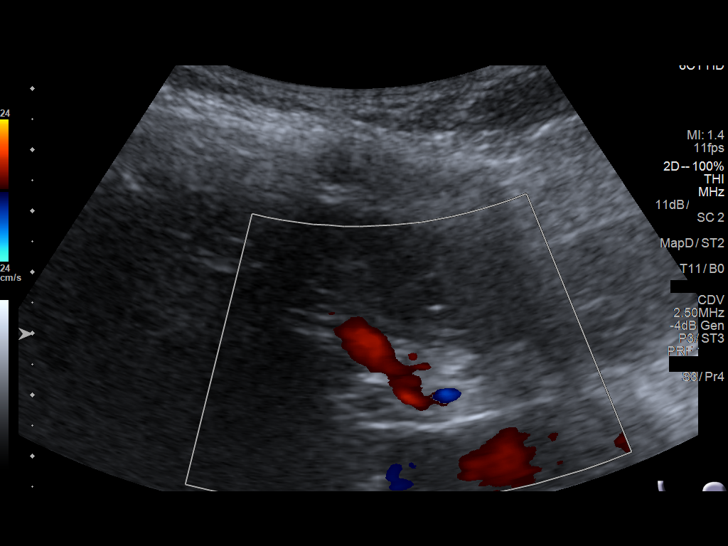
[im 20/44]
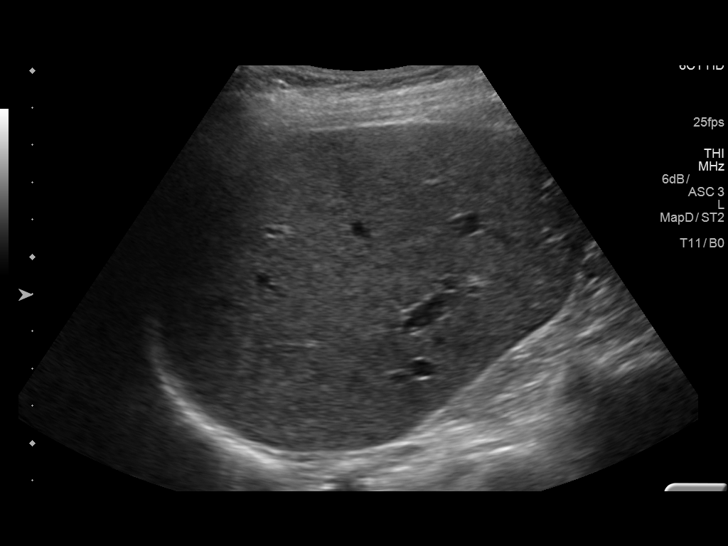
[im 24/44]
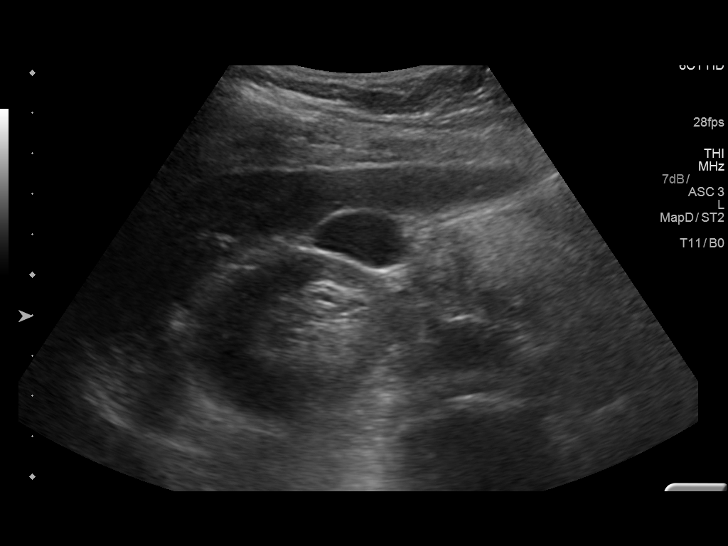
[im 27/44]
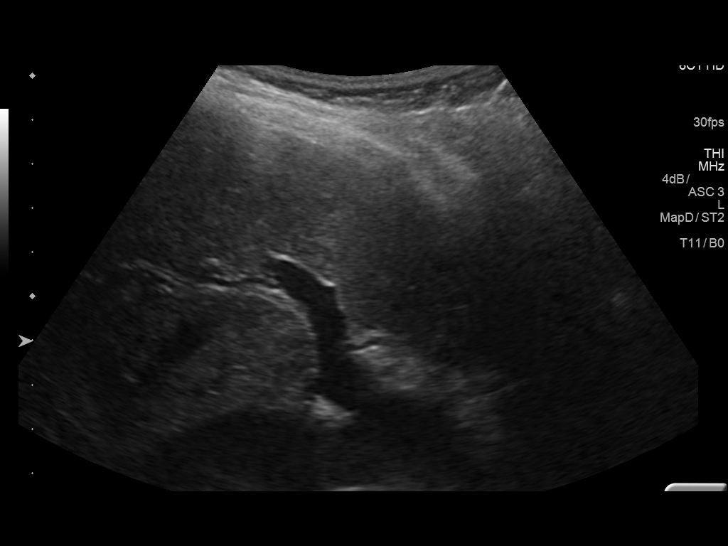
[im 29/44]
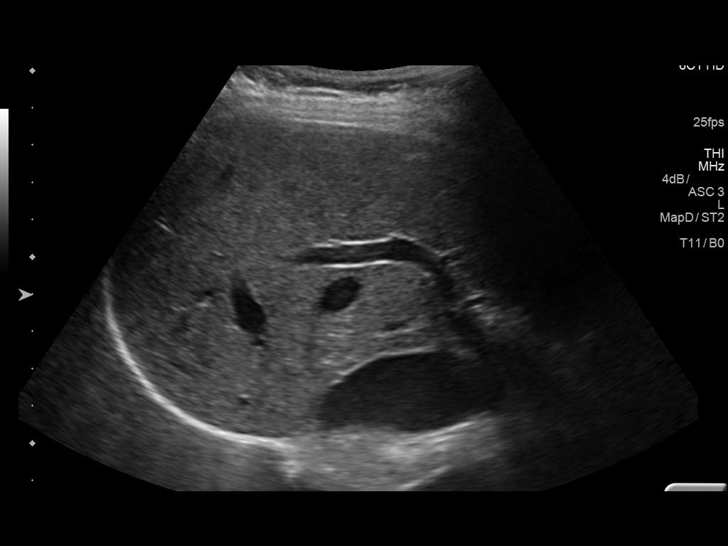
[im 33/44]
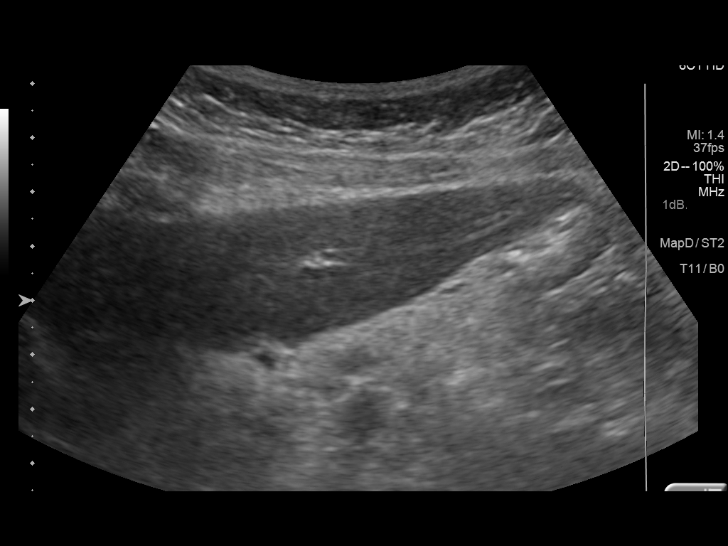
[im 36/44]
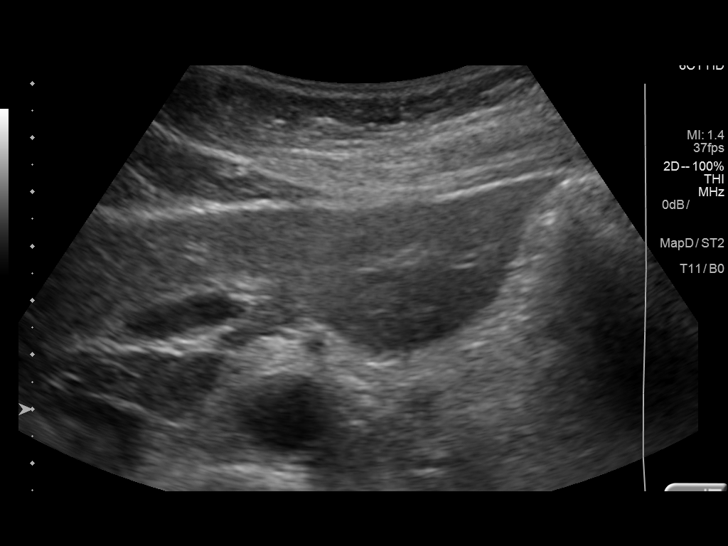
[im 40/44]
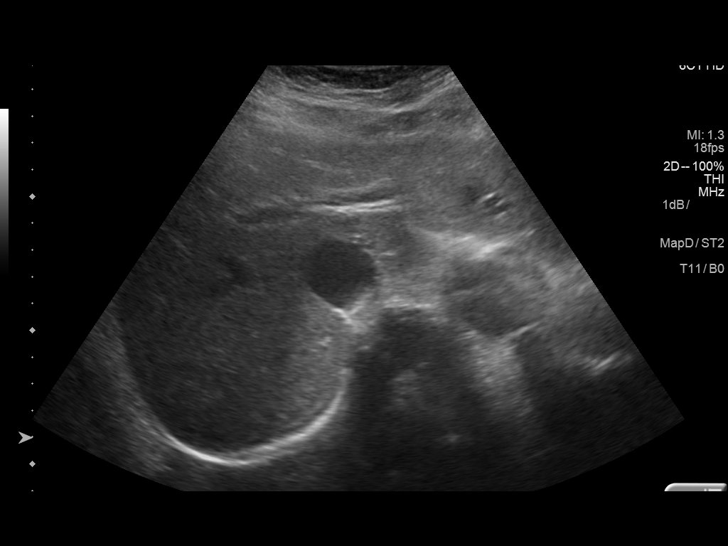
[im 44/44]
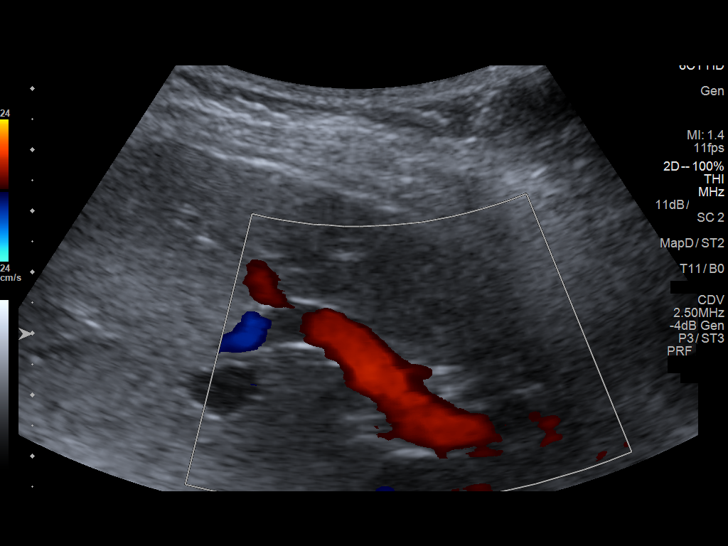

[14 of 25 positions shown; findings below may reference images not displayed]

FINDINGS: Gallbladder:

Gallbladder has a normal appearance. Gallbladder wall is
millimeters, within normal limits. No stones or pericholecystic
fluid. No sonographic Murphy's sign.

Common bile duct:

Diameter: 1.8 millimeters

Liver:

No focal lesion identified. Within normal limits in parenchymal
echogenicity. Portal vein is patent on color Doppler imaging with
normal direction of blood flow towards the liver.
IMPRESSION: 1. Normal appearance of the liver.
2. No evidence for acute cholecystitis.

## 2019-03-23 DIAGNOSIS — Z0001 Encounter for general adult medical examination with abnormal findings: Secondary | ICD-10-CM | POA: Diagnosis not present

## 2019-03-30 DIAGNOSIS — Z1212 Encounter for screening for malignant neoplasm of rectum: Secondary | ICD-10-CM | POA: Diagnosis not present

## 2019-03-30 DIAGNOSIS — R4582 Worries: Secondary | ICD-10-CM | POA: Diagnosis not present

## 2019-03-30 DIAGNOSIS — F410 Panic disorder [episodic paroxysmal anxiety] without agoraphobia: Secondary | ICD-10-CM | POA: Diagnosis not present

## 2019-03-30 DIAGNOSIS — Z6824 Body mass index (BMI) 24.0-24.9, adult: Secondary | ICD-10-CM | POA: Diagnosis not present

## 2019-03-30 DIAGNOSIS — M545 Low back pain: Secondary | ICD-10-CM | POA: Diagnosis not present

## 2019-03-30 DIAGNOSIS — Z0001 Encounter for general adult medical examination with abnormal findings: Secondary | ICD-10-CM | POA: Diagnosis not present

## 2019-03-30 DIAGNOSIS — R5383 Other fatigue: Secondary | ICD-10-CM | POA: Diagnosis not present

## 2019-04-03 DIAGNOSIS — R3989 Other symptoms and signs involving the genitourinary system: Secondary | ICD-10-CM | POA: Diagnosis not present

## 2019-04-03 DIAGNOSIS — R300 Dysuria: Secondary | ICD-10-CM | POA: Diagnosis not present

## 2019-04-03 DIAGNOSIS — M545 Low back pain: Secondary | ICD-10-CM | POA: Diagnosis not present

## 2019-07-23 ENCOUNTER — Other Ambulatory Visit: Payer: Self-pay

## 2019-07-23 NOTE — Telephone Encounter (Signed)
Left message to return call for patient.  Faxed declined prescription back to pharmacy now.

## 2019-07-23 NOTE — Telephone Encounter (Signed)
Phamacy called  Crista L[facility]) requesting refill on patient's Xanax.   Phone # [phone] Fax # [phone]

## 2019-07-24 NOTE — Telephone Encounter (Signed)
Pt aware that we would not be able to refill Xanax at this time and that Dr Bronson Ing is no longer with the practice - scheduled with Dr Harl Bowie 8/10 - pt will contact pcp for refills on Xanax

## 2019-09-09 ENCOUNTER — Encounter: Payer: Self-pay | Admitting: Cardiology

## 2019-09-09 ENCOUNTER — Telehealth (INDEPENDENT_AMBULATORY_CARE_PROVIDER_SITE_OTHER): Payer: 59 | Admitting: Cardiology

## 2019-09-09 DIAGNOSIS — I491 Atrial premature depolarization: Secondary | ICD-10-CM

## 2019-09-09 NOTE — Patient Instructions (Signed)

## 2019-09-09 NOTE — Progress Notes (Signed)
Virtual Visit via Telephone Note   This visit type was conducted due to national recommendations for restrictions regarding the COVID-19 Pandemic (e.g. social distancing) in an effort to limit this patient's exposure and mitigate transmission in our community.  Due to her co-morbid illnesses, this patient is at least at moderate risk for complications without adequate follow up.  This format is felt to be most appropriate for this patient at this time.  The patient did not have access to video technology/had technical difficulties with video requiring transitioning to audio format only (telephone).  All issues noted in this document were discussed and addressed.  No physical exam could be performed with this format.  Please refer to the patient's chart for her  consent to telehealth for [facility].    Date:  09/09/2019   ID:  Stephanie Hamilton, DOB February 11, 1960, MRN [id number] The patient was identified using 2 identifiers.  Patient Location: Home Provider Location: Office/Clinic  PCP:  Caryl Bis, MD  Cardiologist:  Carlyle Dolly, MD  Electrophysiologist:  None   Evaluation Performed:  Follow-Up Visit  Chief Complaint:  Follow up  History of Present Illness:    Stephanie Hamilton is a 59 y.o. female seen today for follow up of the following medical problems.   1. PACs - has been controlled with xanax, Dr Bronson Ing has been refilling - previously when trying to wean significant recurrence of symptoms   - symptoms well controlled - pcp has started prescribing her xanax - limiting caffeine and EtOH      SH: Her son, Hurshel Keys born with hydrocephalus and has had at least 2 VP shunts and suffers with seizures.  Her husband,Bernie,is anechocardiographer at [employer].Their son, [name] (age 29in 05/2016),wasborn with hydrocephalus and has a VP shunt with h/o seizures.     The patient does not have symptoms concerning for COVID-19  infection (fever, chills, cough, or new shortness of breath).    Past Medical History:  Diagnosis Date  . Anxiety   . CAD (coronary artery disease)   . Hypertension   . Obesity   . Palpitations    Past Surgical History:  Procedure Laterality Date  . COLPOPEXY    . WISDOM TOOTH EXTRACTION       Current Meds  Medication Sig  . ALPRAZolam (XANAX) 0.5 MG tablet Take 1 tablet (0.5 mg total) by mouth 2 (two) times daily.  . Calcium Carbonate-Vitamin D (CALTRATE 600+D) 600-400 MG-UNIT tablet Take 2 tablets by mouth daily.  . Cranberry Fruit Concentrate 12600 MG CAPS Take 1 capsule by mouth daily.   . Probiotic Product (PROBIOTIC-10 PO) Take by mouth.  . Pseudoephedrine-Ibuprofen (ADVIL COLD/SINUS PO) Take 1 tablet by mouth daily.     Allergies:   Bactrim [sulfamethoxazole-trimethoprim], Compazine [prochlorperazine edisylate], and Tramadol   Social History   Tobacco Use  . Smoking status: Never Smoker  . Smokeless tobacco: Never Used  Substance Use Topics  . Alcohol use: Not on file  . Drug use: Not on file     Family Hx: The patient's family history includes COPD in her mother; Coronary artery disease in her father and mother; Heart disease in her father and mother; High blood pressure in her father and mother.  ROS:   Please see the history of present illness.     All other systems reviewed and are negative.   Prior CV studies:   The following studies were reviewed today:    Labs/Other Tests and  Data Reviewed:    EKG:  No ECG reviewed.  Recent Labs: No results found for requested labs within last 8760 hours.   Recent Lipid Panel No results found for: CHOL, TRIG, HDL, CHOLHDL, LDLCALC, LDLDIRECT  Wt Readings from Last 3 Encounters:  09/09/19 118 lb (53.5 kg)  08/20/18 115 lb 9.6 oz (52.4 kg)  05/16/17 110 lb (49.9 kg)     Objective:    Vital Signs:  BP 106/66   Pulse 84   Ht 4\' 10"  (1.473 m)   Wt 118 lb (53.5 kg)   SpO2 97%   BMI 24.66 kg/m     Normal affect. Normal speech pattern and tone. Comfortable, no apparent distress. No audible signs of sob or wheezing.   ASSESSMENT & PLAN:    1. PACs - doing well, has previously been managed with benzodiazepines at home which help manage these symptoms, we will continue at tthis time.   COVID-19 Education: The signs and symptoms of COVID-19 were discussed with the patient and how to seek care for testing (follow up with PCP or arrange E-visit).  The importance of social distancing was discussed today.  Time:   Today, I have spent 14 minutes with the patient with telehealth technology discussing the above problems.     Medication Adjustments/Labs and Tests Ordered: Current medicines are reviewed at length with the patient today.  Concerns regarding medicines are outlined above.   Tests Ordered: No orders of the defined types were placed in this encounter.   Medication Changes: No orders of the defined types were placed in this encounter.   Follow Up:  In Person in 1 year(s)  Signed, Carlyle Dolly, MD  09/09/2019 4:24 PM    [place]

## 2019-09-10 ENCOUNTER — Encounter: Payer: Self-pay | Admitting: *Deleted

## 2019-10-05 DIAGNOSIS — F410 Panic disorder [episodic paroxysmal anxiety] without agoraphobia: Secondary | ICD-10-CM | POA: Diagnosis not present

## 2019-10-05 DIAGNOSIS — R4582 Worries: Secondary | ICD-10-CM | POA: Diagnosis not present

## 2019-10-05 DIAGNOSIS — Z6825 Body mass index (BMI) 25.0-25.9, adult: Secondary | ICD-10-CM | POA: Diagnosis not present

## 2019-10-05 DIAGNOSIS — Z1389 Encounter for screening for other disorder: Secondary | ICD-10-CM | POA: Diagnosis not present

## 2019-10-05 DIAGNOSIS — M545 Low back pain: Secondary | ICD-10-CM | POA: Diagnosis not present

## 2019-10-05 DIAGNOSIS — Z1331 Encounter for screening for depression: Secondary | ICD-10-CM | POA: Diagnosis not present

## 2019-10-05 DIAGNOSIS — R5383 Other fatigue: Secondary | ICD-10-CM | POA: Diagnosis not present

## 2020-01-08 DIAGNOSIS — N390 Urinary tract infection, site not specified: Secondary | ICD-10-CM | POA: Diagnosis not present

## 2020-01-08 DIAGNOSIS — Z6825 Body mass index (BMI) 25.0-25.9, adult: Secondary | ICD-10-CM | POA: Diagnosis not present

## 2020-02-16 DIAGNOSIS — Z20828 Contact with and (suspected) exposure to other viral communicable diseases: Secondary | ICD-10-CM | POA: Diagnosis not present

## 2020-02-16 DIAGNOSIS — R059 Cough, unspecified: Secondary | ICD-10-CM | POA: Diagnosis not present

## 2020-04-04 DIAGNOSIS — Z0001 Encounter for general adult medical examination with abnormal findings: Secondary | ICD-10-CM | POA: Diagnosis not present

## 2020-04-04 DIAGNOSIS — Z1329 Encounter for screening for other suspected endocrine disorder: Secondary | ICD-10-CM | POA: Diagnosis not present

## 2020-04-04 DIAGNOSIS — Z1159 Encounter for screening for other viral diseases: Secondary | ICD-10-CM | POA: Diagnosis not present

## 2020-04-04 DIAGNOSIS — Z13220 Encounter for screening for lipoid disorders: Secondary | ICD-10-CM | POA: Diagnosis not present

## 2020-04-04 DIAGNOSIS — R748 Abnormal levels of other serum enzymes: Secondary | ICD-10-CM | POA: Diagnosis not present

## 2020-04-07 DIAGNOSIS — Z6824 Body mass index (BMI) 24.0-24.9, adult: Secondary | ICD-10-CM | POA: Diagnosis not present

## 2020-04-07 DIAGNOSIS — Z23 Encounter for immunization: Secondary | ICD-10-CM | POA: Diagnosis not present

## 2020-04-07 DIAGNOSIS — F410 Panic disorder [episodic paroxysmal anxiety] without agoraphobia: Secondary | ICD-10-CM | POA: Diagnosis not present

## 2020-04-07 DIAGNOSIS — R5383 Other fatigue: Secondary | ICD-10-CM | POA: Diagnosis not present

## 2020-04-07 DIAGNOSIS — S39012A Strain of muscle, fascia and tendon of lower back, initial encounter: Secondary | ICD-10-CM | POA: Diagnosis not present

## 2020-04-07 DIAGNOSIS — Z0001 Encounter for general adult medical examination with abnormal findings: Secondary | ICD-10-CM | POA: Diagnosis not present

## 2020-04-23 DIAGNOSIS — Z1211 Encounter for screening for malignant neoplasm of colon: Secondary | ICD-10-CM | POA: Diagnosis not present

## 2020-04-23 DIAGNOSIS — Z1212 Encounter for screening for malignant neoplasm of rectum: Secondary | ICD-10-CM | POA: Diagnosis not present

## 2020-04-28 LAB — COLOGUARD: COLOGUARD: NEGATIVE

## 2020-04-28 LAB — EXTERNAL GENERIC LAB PROCEDURE: COLOGUARD: NEGATIVE

## 2020-06-28 DIAGNOSIS — J019 Acute sinusitis, unspecified: Secondary | ICD-10-CM | POA: Diagnosis not present

## 2020-07-04 DIAGNOSIS — Z1212 Encounter for screening for malignant neoplasm of rectum: Secondary | ICD-10-CM | POA: Diagnosis not present

## 2020-07-04 DIAGNOSIS — Z01419 Encounter for gynecological examination (general) (routine) without abnormal findings: Secondary | ICD-10-CM | POA: Diagnosis not present

## 2020-10-06 DIAGNOSIS — F410 Panic disorder [episodic paroxysmal anxiety] without agoraphobia: Secondary | ICD-10-CM | POA: Diagnosis not present

## 2020-10-06 DIAGNOSIS — M545 Low back pain, unspecified: Secondary | ICD-10-CM | POA: Diagnosis not present

## 2020-10-06 DIAGNOSIS — Z6824 Body mass index (BMI) 24.0-24.9, adult: Secondary | ICD-10-CM | POA: Diagnosis not present

## 2020-10-06 DIAGNOSIS — Z23 Encounter for immunization: Secondary | ICD-10-CM | POA: Diagnosis not present

## 2020-10-06 DIAGNOSIS — R4582 Worries: Secondary | ICD-10-CM | POA: Diagnosis not present

## 2020-10-06 DIAGNOSIS — R5383 Other fatigue: Secondary | ICD-10-CM | POA: Diagnosis not present

## 2020-10-14 DIAGNOSIS — J329 Chronic sinusitis, unspecified: Secondary | ICD-10-CM | POA: Diagnosis not present

## 2020-10-14 DIAGNOSIS — Z20828 Contact with and (suspected) exposure to other viral communicable diseases: Secondary | ICD-10-CM | POA: Diagnosis not present

## 2020-10-14 DIAGNOSIS — R509 Fever, unspecified: Secondary | ICD-10-CM | POA: Diagnosis not present

## 2020-10-17 ENCOUNTER — Other Ambulatory Visit (HOSPITAL_COMMUNITY): Payer: Self-pay | Admitting: Family Medicine

## 2020-10-17 ENCOUNTER — Other Ambulatory Visit: Payer: Self-pay

## 2020-10-17 ENCOUNTER — Ambulatory Visit (HOSPITAL_COMMUNITY)
Admission: RE | Admit: 2020-10-17 | Discharge: 2020-10-17 | Disposition: A | Discharge status: Home / Self Care | Payer: 59 | Source: Ambulatory Visit | Attending: Family Medicine | Admitting: Family Medicine

## 2020-10-17 DIAGNOSIS — R053 Chronic cough: Secondary | ICD-10-CM

## 2020-10-17 DIAGNOSIS — R059 Cough, unspecified: Secondary | ICD-10-CM | POA: Diagnosis not present

## 2020-11-08 ENCOUNTER — Ambulatory Visit (HOSPITAL_COMMUNITY)
Admission: RE | Admit: 2020-11-08 | Discharge: 2020-11-08 | Disposition: A | Discharge status: Home / Self Care | Payer: 59 | Source: Ambulatory Visit | Attending: Family Medicine | Admitting: Family Medicine

## 2020-11-08 ENCOUNTER — Other Ambulatory Visit (HOSPITAL_COMMUNITY): Payer: Self-pay | Admitting: Family Medicine

## 2020-11-08 ENCOUNTER — Other Ambulatory Visit: Payer: Self-pay

## 2020-11-08 DIAGNOSIS — J189 Pneumonia, unspecified organism: Secondary | ICD-10-CM | POA: Insufficient documentation

## 2020-11-09 NOTE — Progress Notes (Signed)
Cardiology Office Note  Date: 11/09/2020   ID: Stephanie Hamilton, DOB 01/05/1961, MRN [id number]  PCP:  Caryl Bis, MD  Cardiologist:  Carlyle Dolly, MD Electrophysiologist:  None   Chief Complaint: 1 year follow-up  History of Present Illness: Stephanie Hamilton is a 60 y.o. female with a history of CAD, HTN, palpitations, obesity came in anxiety, PACs.  She was last seen by Dr. Harl Bowie on 09/09/2019 via telemedicine.  Her PACs have been controlled with Xanax.  Previously when trying to wean the medication significant recurrence of symptoms occurred.  She was limiting caffeine and EtOH.  She is here today for 1 year follow-up.  She states she had COVID infection earlier in the year and subsequently had right lower lobe pneumonia treated with antibiotics.  She states other than these issues she has had no other problems.  She denies any palpitations.  Blood pressure is well controlled.  Denies any anginal symptoms.  Past Medical History:  Diagnosis Date   Anxiety    CAD (coronary artery disease)    Hypertension    Obesity    Palpitations     Past Surgical History:  Procedure Laterality Date   COLPOPEXY     WISDOM TOOTH EXTRACTION      Current Outpatient Medications  Medication Sig Dispense Refill   ALPRAZolam (XANAX) 0.5 MG tablet Take 1 tablet (0.5 mg total) by mouth 2 (two) times daily. 60 tablet 5   Calcium Carbonate-Vitamin D (CALTRATE 600+D) 600-400 MG-UNIT tablet Take 2 tablets by mouth daily.     Cranberry Fruit Concentrate 12600 MG CAPS Take 1 capsule by mouth daily.      Probiotic Product (PROBIOTIC-10 PO) Take by mouth.     Pseudoephedrine-Ibuprofen (ADVIL COLD/SINUS PO) Take 1 tablet by mouth daily.     No current facility-administered medications for this visit.   Allergies:  Bactrim [sulfamethoxazole-trimethoprim], Compazine [prochlorperazine edisylate], and Tramadol   Social History: The patient  reports that she has never smoked. She has never used  smokeless tobacco.   Family History: The patient's family history includes COPD in her mother; Coronary artery disease in her father and mother; Heart disease in her father and mother; High blood pressure in her father and mother.   ROS:  Please see the history of present illness. Otherwise, complete review of systems is positive for none.  All other systems are reviewed and negative.   Physical Exam: VS:  There were no vitals taken for this visit., BMI There is no height or weight on file to calculate BMI.  Wt Readings from Last 3 Encounters:  09/09/19 118 lb (53.5 kg)  08/20/18 115 lb 9.6 oz (52.4 kg)  05/16/17 110 lb (49.9 kg)    General: Patient appears comfortable at rest. Neck: Supple, no elevated JVP or carotid bruits, no thyromegaly. Lungs: Clear to auscultation, nonlabored breathing at rest. Cardiac: Regular rate and rhythm, no S3 or significant systolic murmur, no pericardial rub. Extremities: No pitting edema, distal pulses 2+. Skin: Warm and dry. Musculoskeletal: No kyphosis. Neuropsychiatric: Alert and oriented x3, affect grossly appropriate.  ECG: 11/10/2020 sinus rhythm rate of 83.  Recent Labwork: No results found for requested labs within last 8760 hours.  No results found for: CHOL, TRIG, HDL, CHOLHDL, VLDL, LDLCALC, LDLDIRECT  Other Studies Reviewed Today:   Assessment and Plan:  1. Palpitations    Denies any palpitations currently.  Managed with Xanax.  Medication Adjustments/Labs and Tests Ordered: Current medicines are reviewed at length with  the patient today.  Concerns regarding medicines are outlined above.   Disposition: Follow-up with Dr. Harl Bowie or APP 1 year  Signed, Levell July, NP 11/09/2020 11:28 PM    [facility], [place], [place] [address] Phone: [phone]; Fax: [phone]

## 2020-11-10 ENCOUNTER — Ambulatory Visit: Payer: 59 | Admitting: Family Medicine

## 2020-11-10 ENCOUNTER — Encounter: Payer: Self-pay | Admitting: Family Medicine

## 2020-11-10 VITALS — BP 126/80 | HR 82 | Ht <= 58 in | Wt 115.8 lb

## 2020-11-10 DIAGNOSIS — R002 Palpitations: Secondary | ICD-10-CM | POA: Diagnosis not present

## 2020-11-10 NOTE — Patient Instructions (Signed)

## 2020-11-11 ENCOUNTER — Encounter: Payer: Self-pay | Admitting: Family Medicine

## 2020-12-01 DIAGNOSIS — H35372 Puckering of macula, left eye: Secondary | ICD-10-CM | POA: Diagnosis not present

## 2020-12-01 DIAGNOSIS — H524 Presbyopia: Secondary | ICD-10-CM | POA: Diagnosis not present

## 2020-12-01 DIAGNOSIS — H25813 Combined forms of age-related cataract, bilateral: Secondary | ICD-10-CM | POA: Diagnosis not present

## 2020-12-01 DIAGNOSIS — D3132 Benign neoplasm of left choroid: Secondary | ICD-10-CM | POA: Diagnosis not present

## 2020-12-01 DIAGNOSIS — H43813 Vitreous degeneration, bilateral: Secondary | ICD-10-CM | POA: Diagnosis not present

## 2021-02-13 ENCOUNTER — Telehealth: Payer: Self-pay | Admitting: Cardiology

## 2021-02-13 NOTE — Telephone Encounter (Signed)
Pt c/o of Chest Pain: 1. Are you having CP right now? NO 2. Are you experiencing any other symptoms (ex. SOB, nausea, vomiting, sweating)? NO 3. How long have you been experiencing CP? OFF & ON SINCE 02-09-2021 4. Is your CP continuous or coming and going? COMES AND GOES 5. Have you taken Nitroglycerin? NO  Received telephone call from Jarah Pember stating that his wife Jacquelyn mentioned to him that she had been having some tightness and heaviness in her chest area with right elbow pain. He states that her vitals are good. Called for an appointment but explained to Mr. Douthitt that we have to do a protocol first. [phone].

## 2021-02-13 NOTE — Telephone Encounter (Signed)
Pt c/o chest discomfort with heaviness.Pt denies any other associated symptom.  Pt has appt with Dr. Harl Bowie on 1/24 @4  pm. Pt advised to seek emergent ER evaluation if discomfort get worse or she starts experiencing other symptoms. Pt and spouse verbalized understanding.  Will fwd to provider as Stephanie Hamilton

## 2021-02-14 ENCOUNTER — Encounter: Payer: Self-pay | Admitting: Cardiology

## 2021-02-14 ENCOUNTER — Ambulatory Visit: Payer: 59 | Admitting: Cardiology

## 2021-02-14 ENCOUNTER — Encounter: Payer: Self-pay | Admitting: *Deleted

## 2021-02-14 ENCOUNTER — Other Ambulatory Visit: Payer: Self-pay

## 2021-02-14 VITALS — BP 152/92 | HR 92 | Ht <= 58 in | Wt 112.5 lb

## 2021-02-14 DIAGNOSIS — R079 Chest pain, unspecified: Secondary | ICD-10-CM | POA: Diagnosis not present

## 2021-02-14 NOTE — Progress Notes (Signed)
Clinical Summary Stephanie Hamilton is a 61 y.o.female seen today for follow up of the following medical problems.    1. PACs - has been controlled with xanax, Dr Bronson Ing has been refilling - previously when trying to wean significant recurrence of symptoms     - symptoms well controlled - pcp has started prescribing her xanax       2. Chest tightness - symptoms started Jan 18th - midchest, lasted about 2 hours while sitting at desk. 5/10 in severity. No other associated symptoms. Tried some tums without benefit. Not positional.  - recurrent episode later that evening simlar episode - repeat episode the next day while typing. Chest tightness midchest, righ arm and elbow also had some pain. Pain was on and off throughout the days.  - some DOE with activities, walking up the basement stairs  - CAD risk factors: mother MI 57, nephew 2 yo MI  - reports recent stress      SH: Her son, Stephanie Hamilton, was born with hydrocephalus and has had at least 2 VP shunts and suffers with seizures.   Her husband, Stephanie Hamilton, is an Research officer, trade union at [employer] and [employer]. Their son, Stephanie Hamilton (age 28 in 05/2016), was born with hydrocephalus and has a VP shunt with h/o seizures.     Past Medical History:  Diagnosis Date   Anxiety    CAD (coronary artery disease)    Hypertension    Obesity    Palpitations      Allergies  Allergen Reactions   Bactrim [Sulfamethoxazole-Trimethoprim] Other (See Comments)    Fast heart rate Shaky feeling   Compazine [Prochlorperazine Edisylate] Other (See Comments)    Made her neck feel like it was being twisted on one side   Tramadol Nausea And Vomiting     Current Outpatient Medications  Medication Sig Dispense Refill   ALPRAZolam (XANAX) 0.5 MG tablet Take 1 tablet (0.5 mg total) by mouth 2 (two) times daily. 60 tablet 5   Calcium Carbonate-Vitamin D 600-400 MG-UNIT tablet Take 2 tablets by mouth daily.     Cranberry Fruit Concentrate  12600 MG CAPS Take 1 capsule by mouth daily.      Probiotic Product (PROBIOTIC-10 PO) Take by mouth.     Pseudoephedrine-Ibuprofen (ADVIL COLD/SINUS PO) Take 1 tablet by mouth daily.     No current facility-administered medications for this visit.     Past Surgical History:  Procedure Laterality Date   COLPOPEXY     WISDOM TOOTH EXTRACTION       Allergies  Allergen Reactions   Bactrim [Sulfamethoxazole-Trimethoprim] Other (See Comments)    Fast heart rate Shaky feeling   Compazine [Prochlorperazine Edisylate] Other (See Comments)    Made her neck feel like it was being twisted on one side   Tramadol Nausea And Vomiting      Family History  Problem Relation Age of Onset   Heart disease Mother    Coronary artery disease Mother    COPD Mother    High blood pressure Mother    Heart disease Father    Coronary artery disease Father    High blood pressure Father      Social History Stephanie Hamilton reports that she has never smoked. She has never used smokeless tobacco. Stephanie Hamilton has no history on file for alcohol use.   Review of Systems CONSTITUTIONAL: No weight loss, fever, chills, weakness or fatigue.  HEENT: Eyes: No visual loss, blurred vision, double vision or yellow  sclerae.No hearing loss, sneezing, congestion, runny nose or sore throat.  SKIN: No rash or itching.  CARDIOVASCULAR: per hpi RESPIRATORY: per hpi GASTROINTESTINAL: No anorexia, nausea, vomiting or diarrhea. No abdominal pain or blood.  GENITOURINARY: No burning on urination, no polyuria NEUROLOGICAL: No headache, dizziness, syncope, paralysis, ataxia, numbness or tingling in the extremities. No change in bowel or bladder control.  MUSCULOSKELETAL: No muscle, back pain, joint pain or stiffness.  LYMPHATICS: No enlarged nodes. No history of splenectomy.  PSYCHIATRIC: No history of depression or anxiety.  ENDOCRINOLOGIC: No reports of sweating, cold or heat intolerance. No polyuria or polydipsia.   Marland Kitchen   Physical Examination Today's Vitals   02/14/21 1555  BP: (!) 152/92  Pulse: 92  SpO2: 99%  Weight: 112 lb 8 oz (51 kg)  Height: 4\' 10"  (1.473 m)   Body mass index is 23.51 kg/m.  Gen: resting comfortably, no acute distress HEENT: no scleral icterus, pupils equal round and reactive, no palptable cervical adenopathy,  CV: RRR, no mr/g no jvd Resp: Clear to auscultation bilaterally GI: abdomen is soft, non-tender, non-distended, normal bowel sounds, no hepatosplenomegaly MSK: extremities are warm, no edema.  Skin: warm, no rash Neuro:  no focal deficits Psych: appropriate affect     Assessment and Plan  1.Chest pain -unclear etiology - will plan for exercise nuclear stress test for further evalaute      Arnoldo Lenis, M.D.

## 2021-02-14 NOTE — Patient Instructions (Addendum)
Medication Instructions:  Your physician recommends that you continue on your current medications as directed. Please refer to the Current Medication list given to you today.  Labwork: none  Testing/Procedures: Your physician has requested that you have en exercise stress myoview. For further information please visit [email or web address]. Please follow instruction sheet, as given.  Follow-Up: Your physician recommends that you schedule a follow-up appointment in: pending  Any Other Special Instructions Will Be Listed Below (If Applicable).  If you need a refill on your cardiac medications before your next appointment, please call your pharmacy. 

## 2021-02-17 ENCOUNTER — Encounter (HOSPITAL_COMMUNITY): Payer: Self-pay

## 2021-02-17 ENCOUNTER — Ambulatory Visit (HOSPITAL_COMMUNITY)
Admission: RE | Admit: 2021-02-17 | Discharge: 2021-02-17 | Disposition: A | Discharge status: Home / Self Care | Payer: 59 | Source: Ambulatory Visit | Attending: Cardiology | Admitting: Cardiology

## 2021-02-17 ENCOUNTER — Other Ambulatory Visit: Payer: Self-pay

## 2021-02-17 DIAGNOSIS — R079 Chest pain, unspecified: Secondary | ICD-10-CM | POA: Insufficient documentation

## 2021-02-17 LAB — NM MYOCAR MULTI W/SPECT W/WALL MOTION / EF
Angina Index: 0
Duke Treadmill Score: 5
Estimated workload: 7
Exercise duration (min): 5 min
Exercise duration (sec): 23 s
LV dias vol: 60 mL (ref 46–106)
LV sys vol: 23 mL
MPHR: 160 {beats}/min
Nuc Stress EF: 61 %
Peak HR: 153 {beats}/min
Percent HR: 95 %
RATE: 0.3
RPE: 11
Rest HR: 91 {beats}/min
Rest Nuclear Isotope Dose: 10.1 mCi
SDS: 2
SRS: 3
SSS: 5
ST Depression (mm): 0 mm
Stress Nuclear Isotope Dose: 30.3 mCi
TID: 0.98

## 2021-02-17 MED ORDER — TECHNETIUM TC 99M TETROFOSMIN IV KIT
30.0000 | PACK | Freq: Once | INTRAVENOUS | Status: AC | PRN
  Administered 2021-02-17: 30.3 via INTRAVENOUS

## 2021-02-17 MED ORDER — REGADENOSON 0.4 MG/5ML IV SOLN
INTRAVENOUS | Status: AC
  Filled 2021-02-17: qty 5

## 2021-02-17 MED ORDER — TECHNETIUM TC 99M TETROFOSMIN IV KIT
10.0000 | PACK | Freq: Once | INTRAVENOUS | Status: AC | PRN
  Administered 2021-02-17: 10.8 via INTRAVENOUS

## 2021-02-17 MED ORDER — SODIUM CHLORIDE FLUSH 0.9 % IV SOLN
INTRAVENOUS | Status: AC
  Administered 2021-02-17: 10 mL via INTRAVENOUS
  Filled 2021-02-17: qty 10

## 2021-02-23 ENCOUNTER — Telehealth: Payer: Self-pay | Admitting: *Deleted

## 2021-02-23 NOTE — Telephone Encounter (Signed)
Laurine Blazer, LPN  07/23/917  8:02 PM EST Back to Top    Notified, copy to pcp.

## 2021-02-23 NOTE — Telephone Encounter (Signed)
-----   Message from Arnoldo Lenis, MD sent at 02/21/2021 12:36 PM EST ----- Normal stress test, no evidence of any blockages in the heart as the cause of her symptoms   Zandra Abts MD

## 2021-03-02 DIAGNOSIS — Z6823 Body mass index (BMI) 23.0-23.9, adult: Secondary | ICD-10-CM | POA: Diagnosis not present

## 2021-03-02 DIAGNOSIS — J329 Chronic sinusitis, unspecified: Secondary | ICD-10-CM | POA: Diagnosis not present

## 2021-03-02 DIAGNOSIS — R059 Cough, unspecified: Secondary | ICD-10-CM | POA: Diagnosis not present

## 2021-03-02 DIAGNOSIS — Z20828 Contact with and (suspected) exposure to other viral communicable diseases: Secondary | ICD-10-CM | POA: Diagnosis not present

## 2021-04-07 DIAGNOSIS — R748 Abnormal levels of other serum enzymes: Secondary | ICD-10-CM | POA: Diagnosis not present

## 2021-04-07 DIAGNOSIS — Z1329 Encounter for screening for other suspected endocrine disorder: Secondary | ICD-10-CM | POA: Diagnosis not present

## 2021-04-07 DIAGNOSIS — Z0001 Encounter for general adult medical examination with abnormal findings: Secondary | ICD-10-CM | POA: Diagnosis not present

## 2021-04-07 DIAGNOSIS — Z13220 Encounter for screening for lipoid disorders: Secondary | ICD-10-CM | POA: Diagnosis not present

## 2021-04-12 DIAGNOSIS — Z6822 Body mass index (BMI) 22.0-22.9, adult: Secondary | ICD-10-CM | POA: Diagnosis not present

## 2021-04-12 DIAGNOSIS — R5383 Other fatigue: Secondary | ICD-10-CM | POA: Diagnosis not present

## 2021-04-12 DIAGNOSIS — R4582 Worries: Secondary | ICD-10-CM | POA: Diagnosis not present

## 2021-04-12 DIAGNOSIS — F410 Panic disorder [episodic paroxysmal anxiety] without agoraphobia: Secondary | ICD-10-CM | POA: Diagnosis not present

## 2021-04-12 DIAGNOSIS — M545 Low back pain, unspecified: Secondary | ICD-10-CM | POA: Diagnosis not present

## 2021-05-17 DIAGNOSIS — R300 Dysuria: Secondary | ICD-10-CM | POA: Diagnosis not present

## 2021-05-19 ENCOUNTER — Other Ambulatory Visit (HOSPITAL_COMMUNITY): Payer: Self-pay | Admitting: Family Medicine

## 2021-05-19 ENCOUNTER — Ambulatory Visit (HOSPITAL_COMMUNITY)
Admission: RE | Admit: 2021-05-19 | Discharge: 2021-05-19 | Disposition: A | Discharge status: Home / Self Care | Payer: 59 | Source: Ambulatory Visit | Attending: Family Medicine | Admitting: Family Medicine

## 2021-05-19 ENCOUNTER — Ambulatory Visit (HOSPITAL_BASED_OUTPATIENT_CLINIC_OR_DEPARTMENT_OTHER): Payer: 59

## 2021-05-19 ENCOUNTER — Other Ambulatory Visit: Payer: Self-pay | Admitting: Family Medicine

## 2021-05-19 ENCOUNTER — Encounter (HOSPITAL_COMMUNITY): Payer: Self-pay

## 2021-05-19 DIAGNOSIS — Z6824 Body mass index (BMI) 24.0-24.9, adult: Secondary | ICD-10-CM | POA: Diagnosis not present

## 2021-05-19 DIAGNOSIS — N23 Unspecified renal colic: Secondary | ICD-10-CM | POA: Diagnosis not present

## 2021-05-19 DIAGNOSIS — R300 Dysuria: Secondary | ICD-10-CM | POA: Diagnosis not present

## 2021-05-19 DIAGNOSIS — R109 Unspecified abdominal pain: Secondary | ICD-10-CM | POA: Diagnosis not present

## 2021-08-17 DIAGNOSIS — M545 Low back pain, unspecified: Secondary | ICD-10-CM | POA: Diagnosis not present

## 2021-08-17 DIAGNOSIS — Z6823 Body mass index (BMI) 23.0-23.9, adult: Secondary | ICD-10-CM | POA: Diagnosis not present

## 2021-09-27 DIAGNOSIS — Z1212 Encounter for screening for malignant neoplasm of rectum: Secondary | ICD-10-CM | POA: Diagnosis not present

## 2021-09-27 DIAGNOSIS — Z01419 Encounter for gynecological examination (general) (routine) without abnormal findings: Secondary | ICD-10-CM | POA: Diagnosis not present

## 2021-10-13 DIAGNOSIS — R748 Abnormal levels of other serum enzymes: Secondary | ICD-10-CM | POA: Diagnosis not present

## 2021-10-13 DIAGNOSIS — Z13220 Encounter for screening for lipoid disorders: Secondary | ICD-10-CM | POA: Diagnosis not present

## 2021-10-13 DIAGNOSIS — K121 Other forms of stomatitis: Secondary | ICD-10-CM | POA: Diagnosis not present

## 2021-10-18 DIAGNOSIS — Z6824 Body mass index (BMI) 24.0-24.9, adult: Secondary | ICD-10-CM | POA: Diagnosis not present

## 2021-10-18 DIAGNOSIS — Z23 Encounter for immunization: Secondary | ICD-10-CM | POA: Diagnosis not present

## 2021-10-18 DIAGNOSIS — R5383 Other fatigue: Secondary | ICD-10-CM | POA: Diagnosis not present

## 2021-10-18 DIAGNOSIS — R4582 Worries: Secondary | ICD-10-CM | POA: Diagnosis not present

## 2021-10-18 DIAGNOSIS — F410 Panic disorder [episodic paroxysmal anxiety] without agoraphobia: Secondary | ICD-10-CM | POA: Diagnosis not present

## 2021-12-04 DIAGNOSIS — H2513 Age-related nuclear cataract, bilateral: Secondary | ICD-10-CM | POA: Diagnosis not present

## 2021-12-04 DIAGNOSIS — H3589 Other specified retinal disorders: Secondary | ICD-10-CM | POA: Diagnosis not present

## 2021-12-04 DIAGNOSIS — H35372 Puckering of macula, left eye: Secondary | ICD-10-CM | POA: Diagnosis not present

## 2021-12-12 DIAGNOSIS — H524 Presbyopia: Secondary | ICD-10-CM | POA: Diagnosis not present

## 2021-12-12 DIAGNOSIS — H2513 Age-related nuclear cataract, bilateral: Secondary | ICD-10-CM | POA: Diagnosis not present

## 2021-12-12 DIAGNOSIS — H35372 Puckering of macula, left eye: Secondary | ICD-10-CM | POA: Diagnosis not present

## 2021-12-12 DIAGNOSIS — H5213 Myopia, bilateral: Secondary | ICD-10-CM | POA: Diagnosis not present

## 2021-12-18 DIAGNOSIS — R300 Dysuria: Secondary | ICD-10-CM | POA: Diagnosis not present

## 2022-01-24 DIAGNOSIS — R300 Dysuria: Secondary | ICD-10-CM | POA: Diagnosis not present

## 2022-02-20 ENCOUNTER — Encounter: Payer: Self-pay | Admitting: *Deleted

## 2022-02-20 ENCOUNTER — Encounter: Payer: Self-pay | Admitting: Cardiology

## 2022-02-20 ENCOUNTER — Ambulatory Visit: Payer: Commercial Managed Care - PPO | Attending: Cardiology | Admitting: Cardiology

## 2022-02-20 VITALS — BP 138/78 | HR 92 | Ht <= 58 in | Wt 116.8 lb

## 2022-02-20 DIAGNOSIS — R002 Palpitations: Secondary | ICD-10-CM

## 2022-02-20 DIAGNOSIS — R030 Elevated blood-pressure reading, without diagnosis of hypertension: Secondary | ICD-10-CM | POA: Diagnosis not present

## 2022-02-20 DIAGNOSIS — R079 Chest pain, unspecified: Secondary | ICD-10-CM

## 2022-02-20 NOTE — Progress Notes (Signed)
Clinical Summary Stephanie Hamilton is a 62 y.o.female seen today for follow up of the following medical problems.    1. PACs - has been controlled with xanax - previously when trying to wean significant recurrence of symptoms  - pcp has started prescribing her xanax, previously Dr Bronson Ing had  - no recent symptoms         2. Chest tightness - symptoms started Jan 18th - midchest, lasted about 2 hours while sitting at desk. 5/10 in severity. No other associated symptoms. Tried some tums without benefit. Not positional.  - recurrent episode later that evening simlar episode - repeat episode the next day while typing. Chest tightness midchest, righ arm and elbow also had some pain. Pain was on and off throughout the days.  - some DOE with activities, walking up the basement stairs   - CAD risk factors: mother MI 54, nephew 66 yo MI   -Jan 2023 exercise nuclear stress: no EKG changes, Duke score 5, normal perfusion.    - no recent chest pains   3. Elevated blood pressure - recent high stress     SH:    Her husband, [name], is an Research officer, trade union at [employer] and [employer]. Their son, [name] (age 7 in 05/2016), was born with hydrocephalus and has a VP shunt with h/o seizures.   Past Medical History:  Diagnosis Date   Anxiety    CAD (coronary artery disease)    Hypertension    Obesity    Palpitations      Allergies  Allergen Reactions   Bactrim [Sulfamethoxazole-Trimethoprim] Other (See Comments)    Fast heart rate Shaky feeling   Compazine [Prochlorperazine Edisylate] Other (See Comments)    Made her neck feel like it was being twisted on one side   Tramadol Nausea And Vomiting     Current Outpatient Medications  Medication Sig Dispense Refill   ALPRAZolam (XANAX) 0.5 MG tablet Take 1 tablet (0.5 mg total) by mouth 2 (two) times daily. 60 tablet 5   Calcium Carbonate-Vitamin D 600-400 MG-UNIT tablet Take 2 tablets by mouth daily.      Cranberry Fruit Concentrate 12600 MG CAPS Take 1 capsule by mouth daily.      Probiotic Product (PROBIOTIC-10 PO) Take by mouth.     Pseudoephedrine-Ibuprofen (ADVIL COLD/SINUS PO) Take 1 tablet by mouth daily.     No current facility-administered medications for this visit.     Past Surgical History:  Procedure Laterality Date   COLPOPEXY     WISDOM TOOTH EXTRACTION       Allergies  Allergen Reactions   Bactrim [Sulfamethoxazole-Trimethoprim] Other (See Comments)    Fast heart rate Shaky feeling   Compazine [Prochlorperazine Edisylate] Other (See Comments)    Made her neck feel like it was being twisted on one side   Tramadol Nausea And Vomiting      Family History  Problem Relation Age of Onset   Heart disease Mother    Coronary artery disease Mother    COPD Mother    High blood pressure Mother    Heart disease Father    Coronary artery disease Father    High blood pressure Father      Social History Ms. Deniston reports that she has never smoked. She has never used smokeless tobacco. Ms. Harms reports no history of alcohol use.   Review of Systems CONSTITUTIONAL: No weight loss, fever, chills, weakness or fatigue.  HEENT: Eyes: No visual loss,  blurred vision, double vision or yellow sclerae.No hearing loss, sneezing, congestion, runny nose or sore throat.  SKIN: No rash or itching.  CARDIOVASCULAR: per hpi RESPIRATORY: No shortness of breath, cough or sputum.  GASTROINTESTINAL: No anorexia, nausea, vomiting or diarrhea. No abdominal pain or blood.  GENITOURINARY: No burning on urination, no polyuria NEUROLOGICAL: No headache, dizziness, syncope, paralysis, ataxia, numbness or tingling in the extremities. No change in bowel or bladder control.  MUSCULOSKELETAL: No muscle, back pain, joint pain or stiffness.  LYMPHATICS: No enlarged nodes. No history of splenectomy.  PSYCHIATRIC: No history of depression or anxiety.  ENDOCRINOLOGIC: No reports of sweating, cold  or heat intolerance. No polyuria or polydipsia.  Marland Kitchen   Physical Examination Today's Vitals   02/20/22 1556  BP: (!) 144/90  Pulse: 92  SpO2: 99%  Weight: 116 lb 12.8 oz (53 kg)  Height: '4\' 10"'$  (1.473 m)   Body mass index is 24.41 kg/m.  Gen: resting comfortably, no acute distress HEENT: no scleral icterus, pupils equal round and reactive, no palptable cervical adenopathy,  CV: RRR, no m/r,g, no jvd Resp: Clear to auscultation bilaterally GI: abdomen is soft, non-tender, non-distended, normal bowel sounds, no hepatosplenomegaly MSK: extremities are warm, no edema.  Skin: warm, no rash Neuro:  no focal deficits Psych: appropriate affect   Diagnostic Studies  Jan 2023 nuclear stress The study is normal. The study is low risk.   No ST deviation was noted. The ECG was negative for ischemia. Low risk Duke treadmill score of 5.   LV perfusion is normal.  There is breast attenuation artifact with no definitive myocardial perfusion defects to indicate scar or ischemia.   Left ventricular function is normal. Nuclear stress EF: 61 %.   Overall low risk study with breast attenuation artifact, no definitive myocardial perfusion defects to indicate scar or ischemia.  LVEF is normal at 61%.   Assessment and Plan  1.Chest pain -negative stress test last year - no recurrent symptoms, no further testing indicated - EKG SR, no ischemic changes  2. Palpitations/PACs - no recent symptoms, continue current meds  3. Elevated bp - update [place] on bp's end of the week, if above 130/80 on average  would start norvasc '5mg'$  daily.  F/u1 year  Arnoldo Lenis, M.D.

## 2022-02-20 NOTE — Patient Instructions (Signed)

## 2022-04-24 DIAGNOSIS — Z1329 Encounter for screening for other suspected endocrine disorder: Secondary | ICD-10-CM | POA: Diagnosis not present

## 2022-04-24 DIAGNOSIS — Z0001 Encounter for general adult medical examination with abnormal findings: Secondary | ICD-10-CM | POA: Diagnosis not present

## 2022-04-24 DIAGNOSIS — Z13220 Encounter for screening for lipoid disorders: Secondary | ICD-10-CM | POA: Diagnosis not present

## 2022-04-24 DIAGNOSIS — R748 Abnormal levels of other serum enzymes: Secondary | ICD-10-CM | POA: Diagnosis not present

## 2022-06-12 DIAGNOSIS — H2513 Age-related nuclear cataract, bilateral: Secondary | ICD-10-CM | POA: Diagnosis not present

## 2022-06-12 DIAGNOSIS — H35372 Puckering of macula, left eye: Secondary | ICD-10-CM | POA: Diagnosis not present

## 2022-06-12 DIAGNOSIS — H04123 Dry eye syndrome of bilateral lacrimal glands: Secondary | ICD-10-CM | POA: Diagnosis not present

## 2022-09-03 DIAGNOSIS — H16143 Punctate keratitis, bilateral: Secondary | ICD-10-CM | POA: Diagnosis not present

## 2022-10-25 DIAGNOSIS — Z13220 Encounter for screening for lipoid disorders: Secondary | ICD-10-CM | POA: Diagnosis not present

## 2022-10-25 DIAGNOSIS — Z1321 Encounter for screening for nutritional disorder: Secondary | ICD-10-CM | POA: Diagnosis not present

## 2022-10-25 DIAGNOSIS — R748 Abnormal levels of other serum enzymes: Secondary | ICD-10-CM | POA: Diagnosis not present

## 2022-10-25 DIAGNOSIS — R5383 Other fatigue: Secondary | ICD-10-CM | POA: Diagnosis not present

## 2022-11-01 DIAGNOSIS — R030 Elevated blood-pressure reading, without diagnosis of hypertension: Secondary | ICD-10-CM | POA: Diagnosis not present

## 2022-11-01 DIAGNOSIS — F410 Panic disorder [episodic paroxysmal anxiety] without agoraphobia: Secondary | ICD-10-CM | POA: Diagnosis not present

## 2022-11-01 DIAGNOSIS — Z6836 Body mass index (BMI) 36.0-36.9, adult: Secondary | ICD-10-CM | POA: Diagnosis not present

## 2022-11-01 DIAGNOSIS — R5383 Other fatigue: Secondary | ICD-10-CM | POA: Diagnosis not present

## 2022-11-01 DIAGNOSIS — R4582 Worries: Secondary | ICD-10-CM | POA: Diagnosis not present

## 2022-11-01 DIAGNOSIS — Z23 Encounter for immunization: Secondary | ICD-10-CM | POA: Diagnosis not present

## 2022-11-22 IMAGING — CT CT ABD-PELV W/O CM
2 of 3 series · 16 of 46 positions shown, 18 images · non-contrast
Comparison: Ultrasound of right upper quadrant done on 11/22/2017

CLINICAL DATA: Abdominal pain, back pain



[Series 4: coronal · coronal · 0.61mm/px · 3 of 101 slices shown]
[im 34/101  soft-tissue]
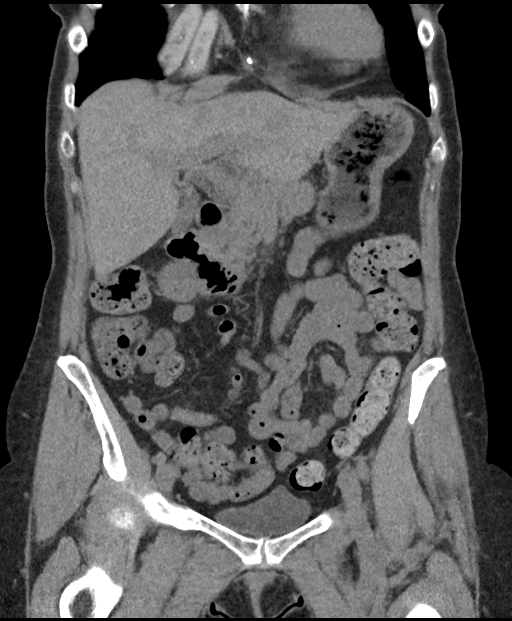
[im 45/101  soft-tissue]
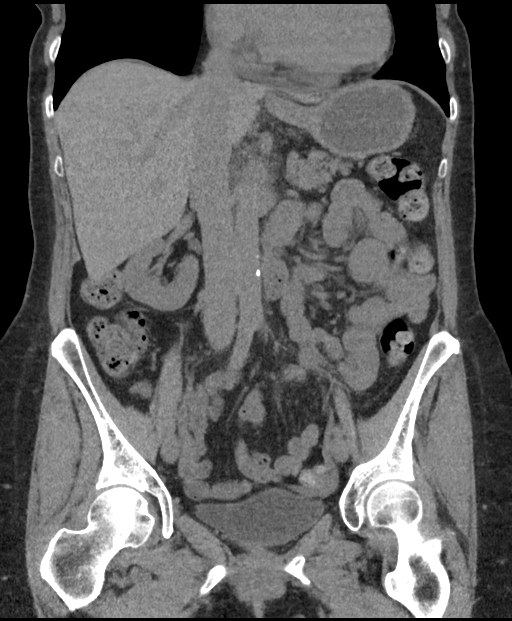
[im 56/101  soft-tissue]
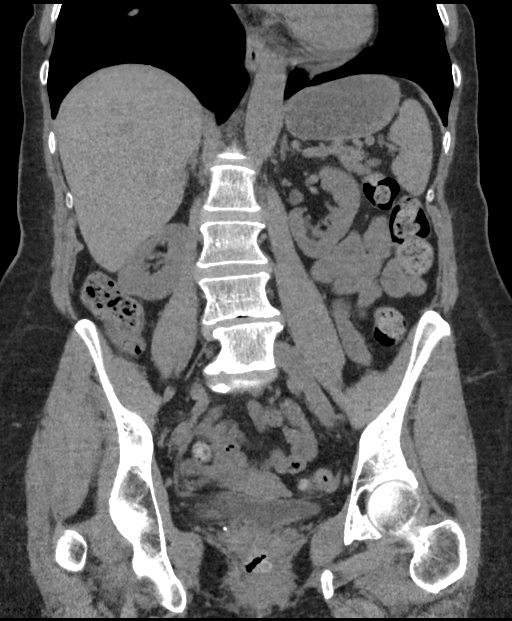

[Series 6: lung bases · axial · 0.59mm/px · z∈[-141,-61]mm · 13 of 48 slices shown, 15 images]
[im 4/48  soft-tissue]
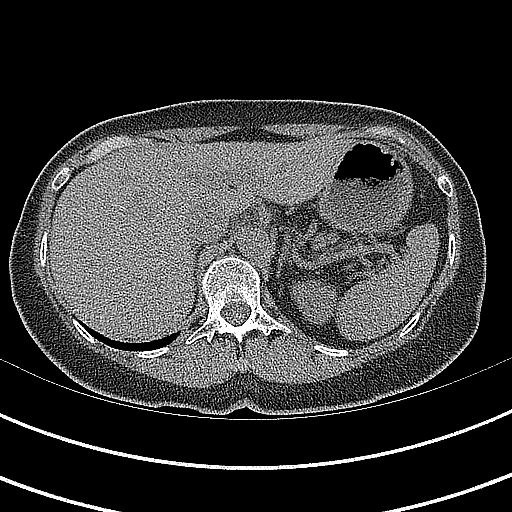
[im 4/48  bone]
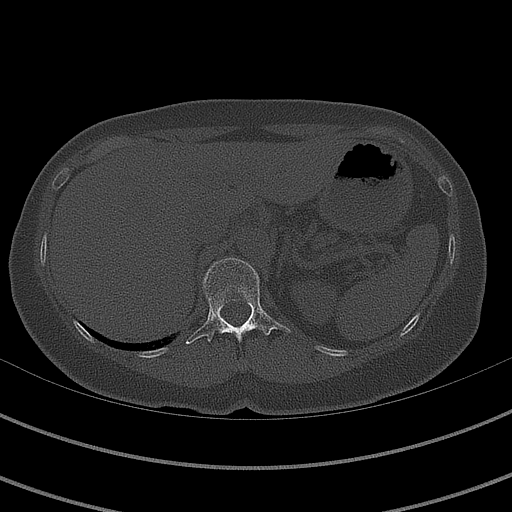
[im 7/48  soft-tissue]
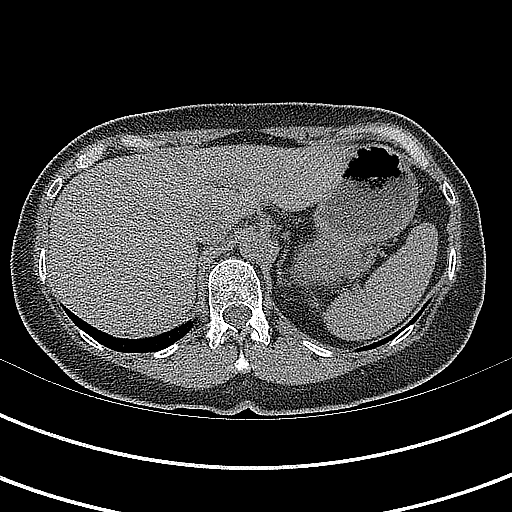
[im 10/48  soft-tissue]
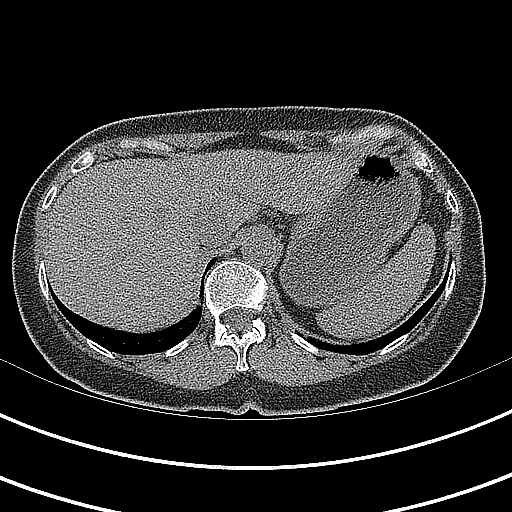
[im 14/48  soft-tissue]
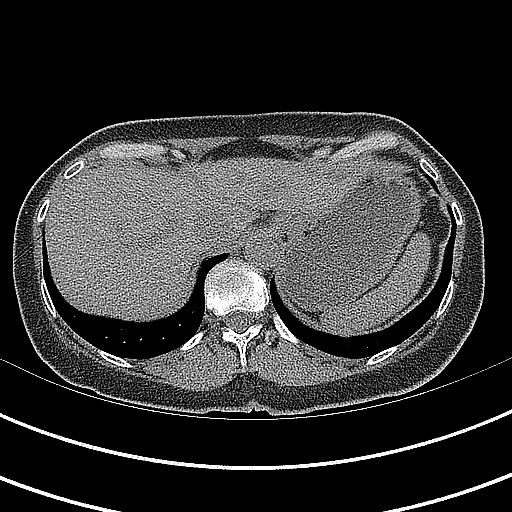
[im 17/48  soft-tissue]
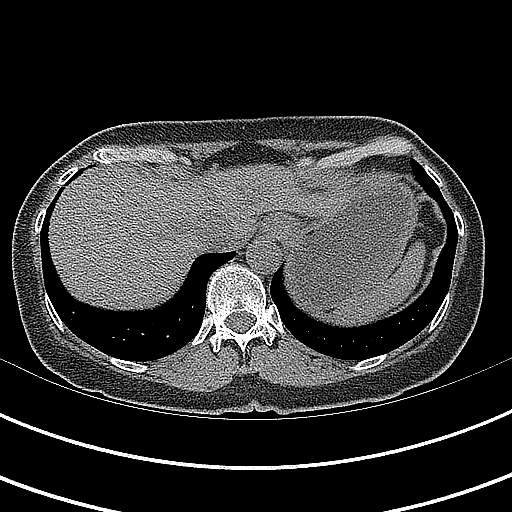
[im 20/48  soft-tissue]
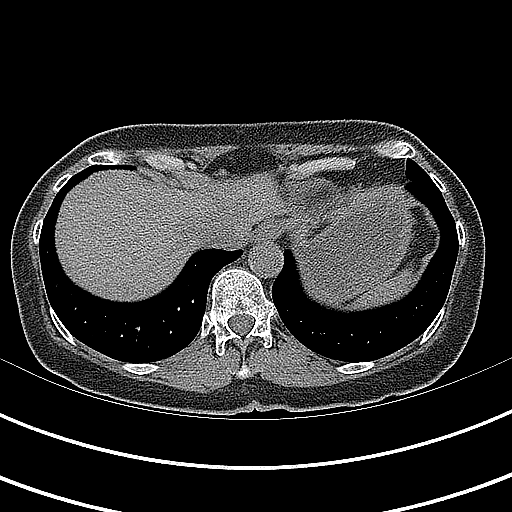
[im 25/48  soft-tissue]
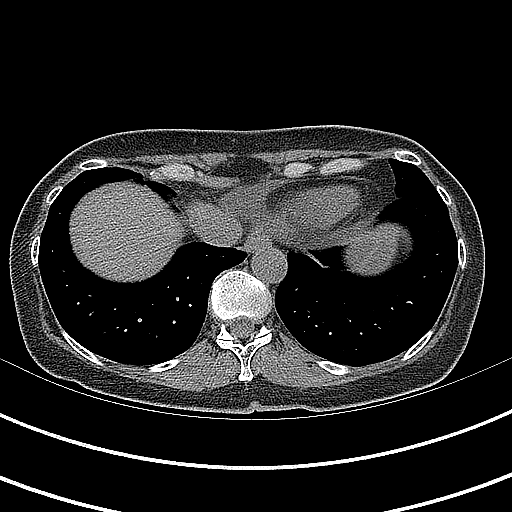
[im 28/48  soft-tissue]
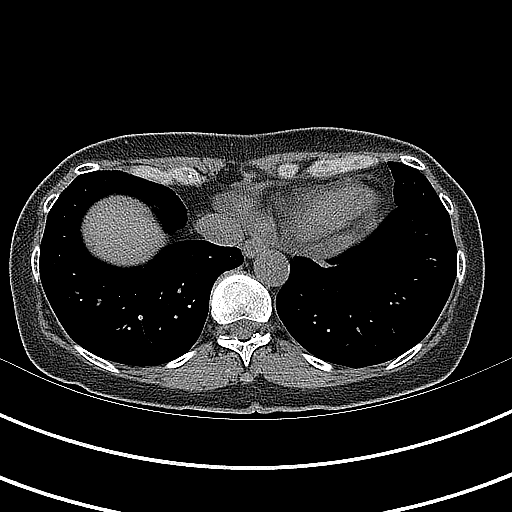
[im 31/48  soft-tissue]
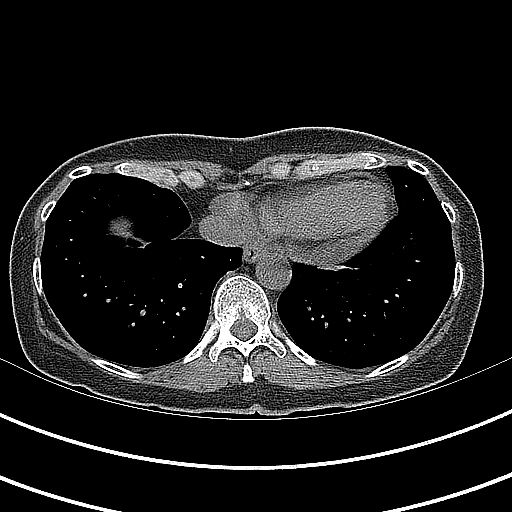
[im 31/48  bone]
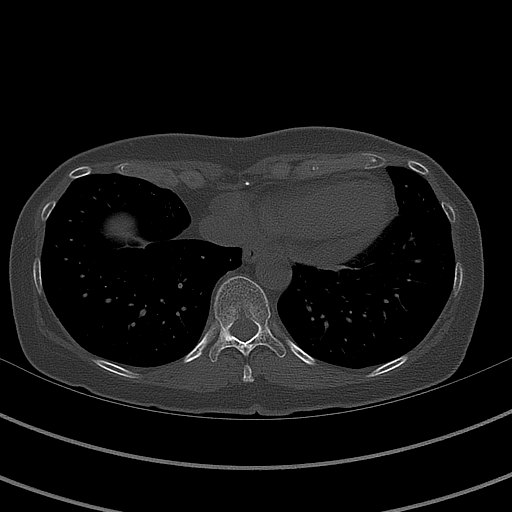
[im 34/48  soft-tissue]
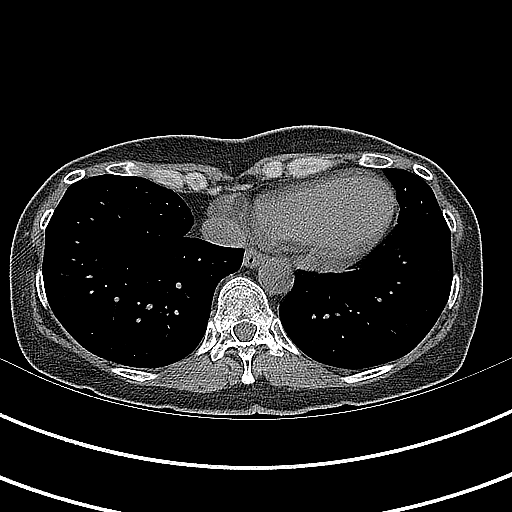
[im 38/48  soft-tissue]
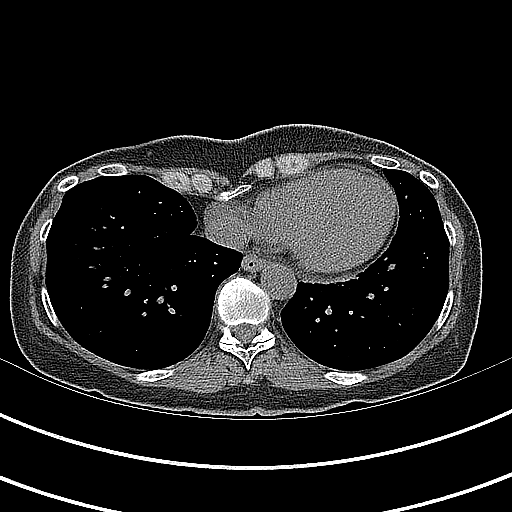
[im 41/48  soft-tissue]
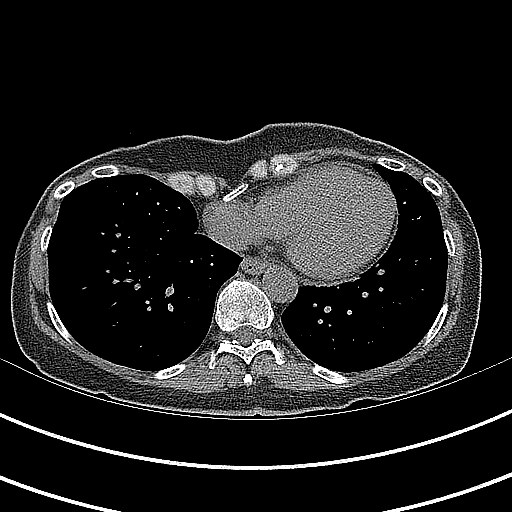
[im 44/48  soft-tissue]
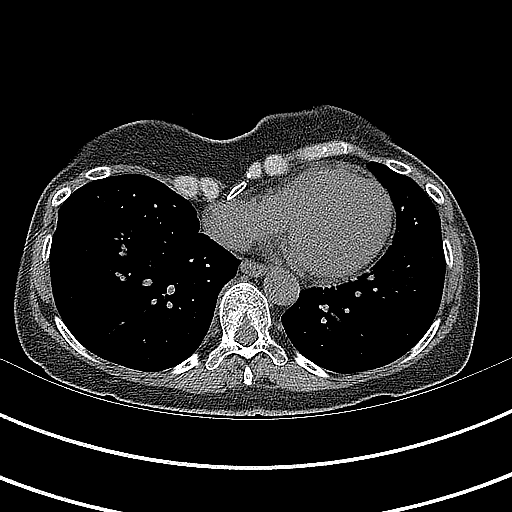

[16 of 46 positions shown; findings below may reference images not displayed]

FINDINGS: Lower chest: Pectus excavatum deformity is seen. Visualized lower
lung fields are clear. Small pericardial effusion is seen.

Hepatobiliary: No focal abnormality is seen in the liver.
Gallbladder is unremarkable.

Pancreas: There is stranding in the fat planes along the posterior
aspect of head and body of pancreas adjacent to the superior
mesenteric vessels. There is no dilation of pancreatic duct. There
are no loculated fluid collections in the or around the pancreas.

Spleen: Unremarkable.

Adrenals/Urinary Tract: Adrenals are unremarkable. There is no
hydronephrosis. There is malrotation in the right kidney. There are
no renal or ureteral stones. Urinary bladder is not distended.

Stomach/Bowel: Stomach is unremarkable. Small bowel loops are not
dilated. Appendix is not seen. There is no pericecal inflammation.
There is no significant wall thickening in the colon. There is no
pericolic stranding.

Vascular/Lymphatic: Scattered arterial calcifications are seen.

Reproductive: Unremarkable.

Other: There is no ascites or pneumoperitoneum.

Musculoskeletal: Degenerative changes are noted in the lumbar spine
at L4-L5 and L5-S1 levels with encroachment of neural foramina.
IMPRESSION: There is stranding in the fat planes along the posterior aspect of
head and body of pancreas adjacent to the superior mesenteric
vessels. Findings may suggest acute pancreatitis or inflammation in
the duodenum or inflammation in the mesentery. There is no loculated
fluid collection in or around the pancreas. Please correlate with
laboratory findings.

There is no evidence of intestinal obstruction or pneumoperitoneum.
There is no hydronephrosis.

Small pericardial effusion is present.

Other findings as described in the body of the report.

## 2023-02-19 DIAGNOSIS — N3001 Acute cystitis with hematuria: Secondary | ICD-10-CM | POA: Diagnosis not present

## 2023-02-19 DIAGNOSIS — R300 Dysuria: Secondary | ICD-10-CM | POA: Diagnosis not present

## 2023-02-19 DIAGNOSIS — R509 Fever, unspecified: Secondary | ICD-10-CM | POA: Diagnosis not present

## 2023-02-19 DIAGNOSIS — N3090 Cystitis, unspecified without hematuria: Secondary | ICD-10-CM | POA: Diagnosis not present

## 2023-04-05 DIAGNOSIS — H35372 Puckering of macula, left eye: Secondary | ICD-10-CM | POA: Diagnosis not present

## 2023-04-05 DIAGNOSIS — H52223 Regular astigmatism, bilateral: Secondary | ICD-10-CM | POA: Diagnosis not present

## 2023-04-05 DIAGNOSIS — H2513 Age-related nuclear cataract, bilateral: Secondary | ICD-10-CM | POA: Diagnosis not present

## 2023-04-05 DIAGNOSIS — H04123 Dry eye syndrome of bilateral lacrimal glands: Secondary | ICD-10-CM | POA: Diagnosis not present

## 2023-04-16 ENCOUNTER — Ambulatory Visit: Payer: Commercial Managed Care - PPO | Admitting: Cardiology

## 2023-04-19 ENCOUNTER — Ambulatory Visit: Payer: Commercial Managed Care - PPO | Attending: Student | Admitting: Student

## 2023-04-19 ENCOUNTER — Encounter: Payer: Self-pay | Admitting: Student

## 2023-04-19 VITALS — BP 118/70 | HR 90 | Ht <= 58 in | Wt 122.0 lb

## 2023-04-19 DIAGNOSIS — R002 Palpitations: Secondary | ICD-10-CM

## 2023-04-19 DIAGNOSIS — Z87898 Personal history of other specified conditions: Secondary | ICD-10-CM | POA: Diagnosis not present

## 2023-04-19 NOTE — Progress Notes (Signed)
 Cardiology Office Note    Date:  04/19/2023  ID:  Stephanie KENDRIX, DOB Sep 30, 1960, MRN [id number] Cardiologist: Dina Rich, MD    History of Present Illness:    Stephanie Hamilton is a 63 y.o. female with past medical history of chest pain (low-risk NST in 01/2021), PAC's and history of elevated BP (not currently on medical therapy) who presents to the office today for annual follow-up.  She was last examined by Dr. Wyline Hamilton in 01/2022 and denied any recent chest pain or palpitations at that time. Her BP was elevated at 144/90 and she was encouraged to follow readings at home and report back with these. If BP remained above goal, was recommended to start Amlodipine 5 mg daily.  In talking with the patient and her son today, she reports things have been stable since her last office visit. She denies any progressive dyspnea on exertion or exertional chest pain. Reports occasional episodes of chest discomfort at rest but she feels these are due to acid reflux as symptoms resolve with antacids. She does report 1 episode of palpitations which actually occurred yesterday and this was after consuming chocolate. Also feels like stress could have been contributing and says her heart rate was in the 90's at that time. Denies any symptoms since. No recent orthopnea or PND. She does experience occasional lower extremity edema and has a history of varicose veins requiring treatment in the past. She has been checking her blood pressure at home and this has been well-controlled with SBP typically in the 110's to 120's.  Studies Reviewed:   EKG: EKG is ordered today and demonstrates:   EKG Interpretation Date/Time:  Friday April 19 2023 13:20:04 EDT Ventricular Rate:  90 PR Interval:  132 QRS Duration:  70 QT Interval:  354 QTC Calculation: 433 R Axis:   47  Text Interpretation: Normal sinus rhythm Possible Left atrial enlargement Isolated TWI along Lead III. Confirmed by Randall An ([id number]) on  04/19/2023 1:26:17 PM       NST: 01/2021   The study is normal. The study is low risk.   No ST deviation was noted. The ECG was negative for ischemia. Low risk Duke treadmill score of 5.   LV perfusion is normal.  There is breast attenuation artifact with no definitive myocardial perfusion defects to indicate scar or ischemia.   Left ventricular function is normal. Nuclear stress EF: 61 %.   Overall low risk study with breast attenuation artifact, no definitive myocardial perfusion defects to indicate scar or ischemia.  LVEF is normal at 61%.   Physical Exam:   VS:  BP 118/70   Pulse 90   Ht 4\' 10"  (1.473 m)   Wt 122 lb (55.3 kg)   SpO2 98%   BMI 25.50 kg/m    Wt Readings from Last 3 Encounters:  04/19/23 122 lb (55.3 kg)  02/20/22 116 lb 12.8 oz (53 kg)  02/14/21 112 lb 8 oz (51 kg)     GEN: Pleasant female appearing in no acute distress NECK: No JVD; No carotid bruits CARDIAC: RRR, no murmurs, rubs, gallops RESPIRATORY:  Clear to auscultation without rales, wheezing or rhonchi  ABDOMEN: Appears non-distended. No obvious abdominal masses. EXTREMITIES: No clubbing or cyanosis. No pitting edema.  Varicose veins noted. Distal pedal pulses are 2+ bilaterally.   Assessment and Plan:   1. History of Chest Pain - She denies any recent exertional chest pain and only reports occasional episodes at rest and these have resolved  with Tums. NST in 01/2021 showed no evidence of ischemia was a low-risk study. - We reviewed warning signs to monitor for and she is planning to increase her physical activity over the next few months. If she were to develop concerning symptoms in the future, would recommend a Coronary CTA given prior NST showing breast artifact.  2. Palpitations - She does have occasional, brief palpitations but no persistent symptoms. Was noted to have PAC's in the past. EKG today shows normal sinus rhythm.   - Reviewed the importance of limiting caffeine intake. If she  developed more frequent symptoms in the future or tachy-palpitations, could arrange for a 7-day Zio patch. She is having follow-up labs with her PCP next week will add a Mg to this as her other routine labs are being obtained.   Signed, Ellsworth Lennox, PA-C

## 2023-04-19 NOTE — Patient Instructions (Signed)
 Medication Instructions:  Your physician recommends that you continue on your current medications as directed. Please refer to the Current Medication list given to you today.   Labwork: Magnesium   Testing/Procedures: None today  Follow-Up: 1 year with Dr.Branch or [place] Strader,PA-C  Any Other Special Instructions Will Be Listed Below (If Applicable).  If you need a refill on your cardiac medications before your next appointment, please call your pharmacy.

## 2023-04-25 DIAGNOSIS — Z0000 Encounter for general adult medical examination without abnormal findings: Secondary | ICD-10-CM | POA: Diagnosis not present

## 2023-04-25 DIAGNOSIS — R5383 Other fatigue: Secondary | ICD-10-CM | POA: Diagnosis not present

## 2023-04-25 DIAGNOSIS — R748 Abnormal levels of other serum enzymes: Secondary | ICD-10-CM | POA: Diagnosis not present

## 2023-04-25 DIAGNOSIS — Z13220 Encounter for screening for lipoid disorders: Secondary | ICD-10-CM | POA: Diagnosis not present

## 2023-04-25 DIAGNOSIS — Z1321 Encounter for screening for nutritional disorder: Secondary | ICD-10-CM | POA: Diagnosis not present

## 2023-04-25 DIAGNOSIS — Z1329 Encounter for screening for other suspected endocrine disorder: Secondary | ICD-10-CM | POA: Diagnosis not present

## 2023-05-03 ENCOUNTER — Encounter (INDEPENDENT_AMBULATORY_CARE_PROVIDER_SITE_OTHER): Payer: Self-pay | Admitting: *Deleted

## 2023-05-08 DIAGNOSIS — M5459 Other low back pain: Secondary | ICD-10-CM | POA: Diagnosis not present

## 2023-05-08 DIAGNOSIS — M5451 Vertebrogenic low back pain: Secondary | ICD-10-CM | POA: Diagnosis not present

## 2023-05-09 ENCOUNTER — Other Ambulatory Visit (HOSPITAL_COMMUNITY): Payer: Self-pay | Admitting: Otolaryngology

## 2023-05-09 DIAGNOSIS — M5459 Other low back pain: Secondary | ICD-10-CM

## 2023-05-19 ENCOUNTER — Ambulatory Visit (HOSPITAL_COMMUNITY)
Admission: RE | Admit: 2023-05-19 | Discharge: 2023-05-19 | Disposition: A | Discharge status: Home / Self Care | Source: Ambulatory Visit | Attending: Otolaryngology | Admitting: Otolaryngology

## 2023-05-19 DIAGNOSIS — M5459 Other low back pain: Secondary | ICD-10-CM | POA: Insufficient documentation

## 2023-05-19 DIAGNOSIS — M48061 Spinal stenosis, lumbar region without neurogenic claudication: Secondary | ICD-10-CM | POA: Diagnosis not present

## 2023-05-19 DIAGNOSIS — M5136 Other intervertebral disc degeneration, lumbar region with discogenic back pain only: Secondary | ICD-10-CM | POA: Diagnosis not present

## 2023-05-19 DIAGNOSIS — M5126 Other intervertebral disc displacement, lumbar region: Secondary | ICD-10-CM | POA: Diagnosis not present

## 2023-05-19 DIAGNOSIS — M47816 Spondylosis without myelopathy or radiculopathy, lumbar region: Secondary | ICD-10-CM | POA: Diagnosis not present

## 2023-05-30 DIAGNOSIS — H0014 Chalazion left upper eyelid: Secondary | ICD-10-CM | POA: Diagnosis not present

## 2023-05-31 DIAGNOSIS — M47816 Spondylosis without myelopathy or radiculopathy, lumbar region: Secondary | ICD-10-CM | POA: Diagnosis not present

## 2023-05-31 DIAGNOSIS — M5416 Radiculopathy, lumbar region: Secondary | ICD-10-CM | POA: Diagnosis not present

## 2023-06-13 DIAGNOSIS — M5416 Radiculopathy, lumbar region: Secondary | ICD-10-CM | POA: Diagnosis not present

## 2023-07-04 DIAGNOSIS — M5416 Radiculopathy, lumbar region: Secondary | ICD-10-CM | POA: Diagnosis not present

## 2023-07-04 DIAGNOSIS — M47816 Spondylosis without myelopathy or radiculopathy, lumbar region: Secondary | ICD-10-CM | POA: Diagnosis not present

## 2023-07-10 DIAGNOSIS — M5451 Vertebrogenic low back pain: Secondary | ICD-10-CM | POA: Diagnosis not present

## 2023-07-10 DIAGNOSIS — M5416 Radiculopathy, lumbar region: Secondary | ICD-10-CM | POA: Diagnosis not present

## 2023-07-17 DIAGNOSIS — M5451 Vertebrogenic low back pain: Secondary | ICD-10-CM | POA: Diagnosis not present

## 2023-07-17 DIAGNOSIS — M5416 Radiculopathy, lumbar region: Secondary | ICD-10-CM | POA: Diagnosis not present

## 2023-07-19 DIAGNOSIS — M5416 Radiculopathy, lumbar region: Secondary | ICD-10-CM | POA: Diagnosis not present

## 2023-07-19 DIAGNOSIS — M5451 Vertebrogenic low back pain: Secondary | ICD-10-CM | POA: Diagnosis not present

## 2023-07-23 DIAGNOSIS — M5451 Vertebrogenic low back pain: Secondary | ICD-10-CM | POA: Diagnosis not present

## 2023-07-23 DIAGNOSIS — M5416 Radiculopathy, lumbar region: Secondary | ICD-10-CM | POA: Diagnosis not present

## 2023-07-24 DIAGNOSIS — M5451 Vertebrogenic low back pain: Secondary | ICD-10-CM | POA: Diagnosis not present

## 2023-07-24 DIAGNOSIS — M5416 Radiculopathy, lumbar region: Secondary | ICD-10-CM | POA: Diagnosis not present

## 2023-07-31 DIAGNOSIS — M5416 Radiculopathy, lumbar region: Secondary | ICD-10-CM | POA: Diagnosis not present

## 2023-07-31 DIAGNOSIS — M5451 Vertebrogenic low back pain: Secondary | ICD-10-CM | POA: Diagnosis not present

## 2023-08-02 DIAGNOSIS — M5451 Vertebrogenic low back pain: Secondary | ICD-10-CM | POA: Diagnosis not present

## 2023-08-02 DIAGNOSIS — M5416 Radiculopathy, lumbar region: Secondary | ICD-10-CM | POA: Diagnosis not present

## 2023-08-12 DIAGNOSIS — R300 Dysuria: Secondary | ICD-10-CM | POA: Diagnosis not present

## 2023-10-16 DIAGNOSIS — H0011 Chalazion right upper eyelid: Secondary | ICD-10-CM | POA: Diagnosis not present

## 2023-10-22 DIAGNOSIS — Z1329 Encounter for screening for other suspected endocrine disorder: Secondary | ICD-10-CM | POA: Diagnosis not present

## 2023-10-22 DIAGNOSIS — R5383 Other fatigue: Secondary | ICD-10-CM | POA: Diagnosis not present

## 2023-10-22 DIAGNOSIS — Z1321 Encounter for screening for nutritional disorder: Secondary | ICD-10-CM | POA: Diagnosis not present

## 2023-10-22 DIAGNOSIS — Z13220 Encounter for screening for lipoid disorders: Secondary | ICD-10-CM | POA: Diagnosis not present

## 2023-11-05 ENCOUNTER — Encounter (INDEPENDENT_AMBULATORY_CARE_PROVIDER_SITE_OTHER): Payer: Self-pay | Admitting: *Deleted

## 2024-01-14 DIAGNOSIS — R300 Dysuria: Secondary | ICD-10-CM | POA: Diagnosis not present
